# Patient Record
Sex: Female | Born: 1965 | Race: White | Hispanic: No | Marital: Single
Health system: Southern US, Community
[De-identification: ages and names within clinical notes are randomized; demographics above are authoritative.]

## PROBLEM LIST (undated history)

## (undated) DIAGNOSIS — E079 Disorder of thyroid, unspecified: Secondary | ICD-10-CM

## (undated) DIAGNOSIS — J45909 Unspecified asthma, uncomplicated: Secondary | ICD-10-CM

## (undated) DIAGNOSIS — F1910 Other psychoactive substance abuse, uncomplicated: Secondary | ICD-10-CM

## (undated) DIAGNOSIS — F419 Anxiety disorder, unspecified: Secondary | ICD-10-CM

## (undated) DIAGNOSIS — H9190 Unspecified hearing loss, unspecified ear: Secondary | ICD-10-CM

## (undated) DIAGNOSIS — F329 Major depressive disorder, single episode, unspecified: Secondary | ICD-10-CM

## (undated) DIAGNOSIS — R87619 Unspecified abnormal cytological findings in specimens from cervix uteri: Secondary | ICD-10-CM

## (undated) DIAGNOSIS — F32A Depression, unspecified: Secondary | ICD-10-CM

## (undated) DIAGNOSIS — E039 Hypothyroidism, unspecified: Secondary | ICD-10-CM

## (undated) DIAGNOSIS — G43909 Migraine, unspecified, not intractable, without status migrainosus: Secondary | ICD-10-CM

## (undated) DIAGNOSIS — J449 Chronic obstructive pulmonary disease, unspecified: Secondary | ICD-10-CM

## (undated) DIAGNOSIS — N946 Dysmenorrhea, unspecified: Secondary | ICD-10-CM

## (undated) HISTORY — PX: CERVICAL BIOPSY  W/ LOOP ELECTRODE EXCISION: SUR135

## (undated) HISTORY — DX: Migraine, unspecified, not intractable, without status migrainosus: G43.909

## (undated) HISTORY — DX: Anxiety disorder, unspecified: F41.9

## (undated) HISTORY — PX: ILIAC ARTERY STENT: SHX1786

## (undated) HISTORY — DX: Other psychoactive substance abuse, uncomplicated: F19.10

## (undated) HISTORY — DX: Unspecified asthma, uncomplicated: J45.909

## (undated) HISTORY — DX: Major depressive disorder, single episode, unspecified: F32.9

## (undated) HISTORY — DX: Dysmenorrhea, unspecified: N94.6

## (undated) HISTORY — DX: Unspecified abnormal cytological findings in specimens from cervix uteri: R87.619

## (undated) HISTORY — PX: ABDOMINAL HYSTERECTOMY: SHX81

## (undated) HISTORY — PX: CHOLECYSTECTOMY: SHX55

## (undated) HISTORY — DX: Chronic obstructive pulmonary disease, unspecified: J44.9

## (undated) HISTORY — DX: Depression, unspecified: F32.A

---

## 2007-07-26 ENCOUNTER — Inpatient Hospital Stay: Payer: Self-pay | Admitting: Unknown Physician Specialty

## 2008-06-14 ENCOUNTER — Emergency Department (HOSPITAL_COMMUNITY): Admission: EM | Admit: 2008-06-14 | Discharge: 2008-06-14 | Payer: Self-pay | Admitting: Emergency Medicine

## 2011-01-05 ENCOUNTER — Emergency Department (HOSPITAL_COMMUNITY)
Admission: EM | Admit: 2011-01-05 | Discharge: 2011-01-05 | Disposition: A | Discharge status: Home / Self Care | Payer: Self-pay | Attending: Orthopaedic Surgery | Admitting: Orthopaedic Surgery

## 2011-01-05 ENCOUNTER — Emergency Department (HOSPITAL_COMMUNITY): Payer: Self-pay

## 2011-01-05 DIAGNOSIS — J069 Acute upper respiratory infection, unspecified: Secondary | ICD-10-CM | POA: Insufficient documentation

## 2011-01-05 DIAGNOSIS — R0602 Shortness of breath: Secondary | ICD-10-CM | POA: Insufficient documentation

## 2011-01-05 DIAGNOSIS — J40 Bronchitis, not specified as acute or chronic: Secondary | ICD-10-CM | POA: Insufficient documentation

## 2011-01-05 DIAGNOSIS — R062 Wheezing: Secondary | ICD-10-CM | POA: Insufficient documentation

## 2013-01-30 ENCOUNTER — Emergency Department (HOSPITAL_BASED_OUTPATIENT_CLINIC_OR_DEPARTMENT_OTHER): Payer: Self-pay

## 2013-01-30 ENCOUNTER — Emergency Department (HOSPITAL_BASED_OUTPATIENT_CLINIC_OR_DEPARTMENT_OTHER)
Admission: EM | Admit: 2013-01-30 | Discharge: 2013-01-30 | Disposition: A | Discharge status: Home / Self Care | Payer: Self-pay | Attending: Emergency Medicine | Admitting: Emergency Medicine

## 2013-01-30 ENCOUNTER — Encounter (HOSPITAL_BASED_OUTPATIENT_CLINIC_OR_DEPARTMENT_OTHER): Payer: Self-pay | Admitting: *Deleted

## 2013-01-30 DIAGNOSIS — F17200 Nicotine dependence, unspecified, uncomplicated: Secondary | ICD-10-CM | POA: Insufficient documentation

## 2013-01-30 DIAGNOSIS — Y9389 Activity, other specified: Secondary | ICD-10-CM | POA: Insufficient documentation

## 2013-01-30 DIAGNOSIS — Y929 Unspecified place or not applicable: Secondary | ICD-10-CM | POA: Insufficient documentation

## 2013-01-30 DIAGNOSIS — T148XXA Other injury of unspecified body region, initial encounter: Secondary | ICD-10-CM

## 2013-01-30 DIAGNOSIS — Z79899 Other long term (current) drug therapy: Secondary | ICD-10-CM | POA: Insufficient documentation

## 2013-01-30 DIAGNOSIS — IMO0002 Reserved for concepts with insufficient information to code with codable children: Secondary | ICD-10-CM | POA: Insufficient documentation

## 2013-01-30 DIAGNOSIS — E079 Disorder of thyroid, unspecified: Secondary | ICD-10-CM | POA: Insufficient documentation

## 2013-01-30 HISTORY — DX: Disorder of thyroid, unspecified: E07.9

## 2013-01-30 MED ORDER — CEPHALEXIN 500 MG PO CAPS: 500.0000 mg | ORAL_CAPSULE | Freq: Four times a day (QID) | ORAL | Status: DC

## 2013-01-30 MED ORDER — HYDROCODONE-ACETAMINOPHEN 5-325 MG PO TABS
2.0000 | ORAL_TABLET | Freq: Once | ORAL | Status: AC
  Administered 2013-01-30: 2 via ORAL
  Filled 2013-01-30: qty 2

## 2013-01-30 MED ORDER — OXYCODONE-ACETAMINOPHEN 5-325 MG PO TABS: 2.0000 | ORAL_TABLET | ORAL | Status: DC | PRN

## 2013-01-30 MED ORDER — LIDOCAINE-EPINEPHRINE-TETRACAINE (LET) SOLUTION
3.0000 mL | Freq: Once | NASAL | Status: AC
  Administered 2013-01-30: 3 mL via TOPICAL
  Filled 2013-01-30: qty 3

## 2013-01-30 NOTE — ED Provider Notes (Signed)
This chart was scribed for Glynn Octave, MD by Ardelia Mems, ED Scribe. This patient was seen in room MH02/MH02 and the patient's care was started at 9:18 PM.   HPI Pamela Pham is a 47 y.o. female who presents to the Emergency Department complaining of a foreign body in her right lower arm. She states that she was moving a wooden table and accidentally sustained a splinter to the area. She reports associated pain to the area. Husband is in the room and states that he removed at least some of the splinter, but he suspect that there is still some wooden debris remaining. 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 She states that she is otherwise healthy without chronic medical conditions. She states that her Tetanus vaccinations are UTD. She denies having a history of DM. She denies any other pain or symptoms.   PE Findings Puncture wound to right proximal forearm. There is a firm, palpable foreign body, 1 cm proximal to puncture site with shadowing on [place].  Unsuccessful attempted bedside removal of FB. +2 radial pulse, cardinal hand movements intact, sensation intact.  Will need referral to hand surgery.  I personally performed the services described in this documentation, which was scribed in my presence. The recorded information has been reviewed and is accurate.   Glynn Octave, MD 02/03/13 [phone]

## 2013-01-30 NOTE — ED Notes (Signed)
Pt ambulating independently w/ steady gait on d/c in no acute distress, A&Ox4. D/c instructions reviewed w/ pt and family - pt and family deny any further questions or concerns at present. Rx given x2, Pt instructed to not use alcohol, drive, or operate heavy machinery while take the prescription pain medications as they could make him drowsy - pt verbalized understanding.    

## 2013-01-30 NOTE — ED Provider Notes (Signed)
Medical screening examination/treatment/procedure(s) were conducted as a shared visit with non-physician practitioner(s) and myself.  I personally evaluated the patient during the encounter  See my additional note  Glynn Octave, MD 01/30/13 2302

## 2013-01-30 NOTE — ED Notes (Signed)
Pt c/o wooden " spinlter" under skin to right arm

## 2013-01-30 NOTE — ED Provider Notes (Signed)
CSN: [id number]     Arrival date & time 01/30/13  2006 History   First MD Initiated Contact with Patient 01/30/13 2022     Chief Complaint  Patient presents with  . Foreign Body in Skin   (Consider location/radiation/quality/duration/timing/severity/associated sxs/prior Treatment) Patient is a 47 y.o. female presenting with foreign body. The history is provided by the patient. No language interpreter was used.  Foreign Body Intake: right forearm. Suspected object:  Wood Pain quality:  Aching Pain severity:  Moderate Timing:  Constant Progression:  Worsening Chronicity:  New Pt reports she was getting out of her reclyner and a piece of wood splintered into her arm.  Pt's husband reports he pulled out a s[plinter from area.  Pt complains of a lot of pain at firm area above splinter punture.     Past Medical History  Diagnosis Date  . Thyroid disease    Past Surgical History  Procedure Laterality Date  . Cholecystectomy    . Cesarean section     History reviewed. No pertinent family history. History  Substance Use Topics  . Smoking status: Current Every Day Smoker -- 1.00 packs/day    Types: Cigarettes  . Smokeless tobacco: Not on file  . Alcohol Use: No   OB History   Grav Para Term Preterm Abortions TAB SAB Ect Mult Living                 Review of Systems  Skin: Positive for wound.  All other systems reviewed and are negative.    Allergies  Sulfa antibiotics  Home Medications   Current Outpatient Rx  Name  Route  Sig  Dispense  Refill  . levothyroxine (SYNTHROID, LEVOTHROID) 100 MCG tablet   Oral   Take 100 mcg by mouth daily before breakfast.          BP 104/64  Pulse 77  Temp(Src) 98.5 F (36.9 C)  Resp 16  Ht 5\' 2"  (1.575 m)  Wt 135 lb (61.236 kg)  BMI 24.69 kg/m2  SpO2 100%  LMP 01/26/2013 Physical Exam  Vitals reviewed. Constitutional: She is oriented to person, place, and time. She appears well-developed and well-nourished.  HENT:   Head: Normocephalic.  Musculoskeletal: She exhibits tenderness.  Small punture wound forearm.   Pea sized firm area right arm approx 1.5 cm.  Painful to touch  Neurological: She is alert and oriented to person, place, and time. She has normal reflexes.  Skin: Skin is warm.  Psychiatric: She has a normal mood and affect.    ED Course  FOREIGN BODY REMOVAL Date/Time: 01/30/2013 10:20 PM Performed by: Elson Areas Authorized by: Elson Areas Consent: Verbal consent obtained. Consent given by: patient Patient understanding: patient states understanding of the procedure being performed Site marked: the operative site was marked Patient identity confirmed: verbally with patient Time out: Immediately prior to procedure a "time out" was called to verify the correct patient, procedure, equipment, support staff and site/side marked as required. Body area: skin Anesthesia: local infiltration Local anesthetic: lidocaine 2% without epinephrine Patient restrained: no Depth: subcutaneous Complexity: complex 0 objects recovered. Comments: As soon as I made small incision pt had bleeding at site and area of knot like swelling resolved.   I probed area and I am unable to locate foreign body,  Dr. Manus Gunning probed wound as well.  Pt may have persistent foreign body that is deeper than hematoma.     (including critical care time) Labs Review Labs Reviewed - No  data to display Imaging Review No results found. Xray shows no foreign body,   Ultrasound by Dr. Manus Gunning shows no foreign body,  Pt has significant discomfort at area,  I attempted incision at site and was unable to locate foreign body,  Dr. Manus Gunning explored as well.   I counseled pt on foreign bodies,  I suspect pt had a hematoma at site that resolved with small incision.    MDM   1. Foreign body in skin     I was unable to remove foreign body,    I will treat with antibiotics and pain medication.   I advised pt to call Dr. Merlyn Lot to be  seen this week.      Lonia Skinner [place], PA-C 01/30/13 2227

## 2013-02-01 ENCOUNTER — Other Ambulatory Visit: Payer: Self-pay | Admitting: Orthopedic Surgery

## 2013-02-01 ENCOUNTER — Encounter (HOSPITAL_BASED_OUTPATIENT_CLINIC_OR_DEPARTMENT_OTHER): Payer: Self-pay | Admitting: *Deleted

## 2013-02-01 NOTE — Progress Notes (Signed)
No labs needed

## 2013-02-02 ENCOUNTER — Encounter (HOSPITAL_BASED_OUTPATIENT_CLINIC_OR_DEPARTMENT_OTHER): Payer: Self-pay | Admitting: Anesthesiology

## 2013-02-02 ENCOUNTER — Encounter (HOSPITAL_BASED_OUTPATIENT_CLINIC_OR_DEPARTMENT_OTHER)
Admission: RE | Disposition: A | Discharge status: Home / Self Care | Payer: Self-pay | Source: Ambulatory Visit | Attending: Orthopedic Surgery

## 2013-02-02 ENCOUNTER — Ambulatory Visit (HOSPITAL_BASED_OUTPATIENT_CLINIC_OR_DEPARTMENT_OTHER): Payer: Self-pay | Admitting: Anesthesiology

## 2013-02-02 ENCOUNTER — Encounter (HOSPITAL_BASED_OUTPATIENT_CLINIC_OR_DEPARTMENT_OTHER): Payer: Self-pay

## 2013-02-02 ENCOUNTER — Ambulatory Visit (HOSPITAL_BASED_OUTPATIENT_CLINIC_OR_DEPARTMENT_OTHER)
Admission: RE | Admit: 2013-02-02 | Discharge: 2013-02-02 | Disposition: A | Discharge status: Home / Self Care | Payer: Self-pay | Source: Ambulatory Visit | Attending: Orthopedic Surgery | Admitting: Orthopedic Surgery

## 2013-02-02 DIAGNOSIS — E039 Hypothyroidism, unspecified: Secondary | ICD-10-CM | POA: Insufficient documentation

## 2013-02-02 DIAGNOSIS — IMO0002 Reserved for concepts with insufficient information to code with codable children: Secondary | ICD-10-CM

## 2013-02-02 DIAGNOSIS — H9190 Unspecified hearing loss, unspecified ear: Secondary | ICD-10-CM | POA: Insufficient documentation

## 2013-02-02 DIAGNOSIS — F17200 Nicotine dependence, unspecified, uncomplicated: Secondary | ICD-10-CM | POA: Insufficient documentation

## 2013-02-02 DIAGNOSIS — Z79899 Other long term (current) drug therapy: Secondary | ICD-10-CM | POA: Insufficient documentation

## 2013-02-02 DIAGNOSIS — Z1833 Retained wood fragments: Secondary | ICD-10-CM | POA: Insufficient documentation

## 2013-02-02 DIAGNOSIS — S51809A Unspecified open wound of unspecified forearm, initial encounter: Secondary | ICD-10-CM | POA: Insufficient documentation

## 2013-02-02 DIAGNOSIS — X58XXXA Exposure to other specified factors, initial encounter: Secondary | ICD-10-CM | POA: Insufficient documentation

## 2013-02-02 HISTORY — PX: FOREIGN BODY REMOVAL: SHX962

## 2013-02-02 HISTORY — DX: Hypothyroidism, unspecified: E03.9

## 2013-02-02 HISTORY — DX: Unspecified hearing loss, unspecified ear: H91.90

## 2013-02-02 LAB — POCT HEMOGLOBIN-HEMACUE: Hemoglobin: 13.9 g/dL (ref 12.0–15.0)

## 2013-02-02 SURGERY — REMOVAL FOREIGN BODY EXTREMITY
Anesthesia: General | Site: Arm Lower | Laterality: Right | Wound class: Dirty or Infected

## 2013-02-02 MED ORDER — TRAMADOL HCL 50 MG PO TABS: 50.0000 mg | ORAL_TABLET | Freq: Four times a day (QID) | ORAL | Status: DC | PRN

## 2013-02-02 MED ORDER — 0.9 % SODIUM CHLORIDE (POUR BTL) OPTIME
TOPICAL | Status: DC | PRN
  Administered 2013-02-02: 200 mL

## 2013-02-02 MED ORDER — FENTANYL CITRATE 0.05 MG/ML IJ SOLN
INTRAMUSCULAR | Status: DC | PRN
  Administered 2013-02-02: 100 ug via INTRAVENOUS

## 2013-02-02 MED ORDER — ONDANSETRON HCL 4 MG/2ML IJ SOLN: 4.0000 mg | Freq: Once | INTRAMUSCULAR | Status: DC | PRN

## 2013-02-02 MED ORDER — OXYCODONE HCL 5 MG/5ML PO SOLN: 5.0000 mg | Freq: Once | ORAL | Status: DC | PRN

## 2013-02-02 MED ORDER — LIDOCAINE HCL (CARDIAC) 20 MG/ML IV SOLN
INTRAVENOUS | Status: DC | PRN
  Administered 2013-02-02: 80 mg via INTRAVENOUS

## 2013-02-02 MED ORDER — CEFAZOLIN SODIUM-DEXTROSE 2-3 GM-% IV SOLR
INTRAVENOUS | Status: DC | PRN
  Administered 2013-02-02: 2 g via INTRAVENOUS

## 2013-02-02 MED ORDER — PROPOFOL 10 MG/ML IV BOLUS
INTRAVENOUS | Status: DC | PRN
  Administered 2013-02-02: 200 mg via INTRAVENOUS

## 2013-02-02 MED ORDER — MIDAZOLAM HCL 5 MG/5ML IJ SOLN
INTRAMUSCULAR | Status: DC | PRN
  Administered 2013-02-02: 2 mg via INTRAVENOUS

## 2013-02-02 MED ORDER — LACTATED RINGERS IV SOLN
INTRAVENOUS | Status: DC
  Administered 2013-02-02: 11:00:00 via INTRAVENOUS

## 2013-02-02 MED ORDER — DEXAMETHASONE SODIUM PHOSPHATE 10 MG/ML IJ SOLN
INTRAMUSCULAR | Status: DC | PRN
  Administered 2013-02-02: 10 mg via INTRAVENOUS

## 2013-02-02 MED ORDER — OXYCODONE HCL 5 MG PO TABS: 5.0000 mg | ORAL_TABLET | Freq: Once | ORAL | Status: DC | PRN

## 2013-02-02 MED ORDER — ONDANSETRON HCL 4 MG/2ML IJ SOLN
INTRAMUSCULAR | Status: DC | PRN
  Administered 2013-02-02: 4 mg via INTRAMUSCULAR

## 2013-02-02 MED ORDER — BUPIVACAINE HCL (PF) 0.25 % IJ SOLN
INTRAMUSCULAR | Status: DC | PRN
  Administered 2013-02-02: 7 mL

## 2013-02-02 MED ORDER — HYDROMORPHONE HCL PF 1 MG/ML IJ SOLN
0.2500 mg | INTRAMUSCULAR | Status: DC | PRN
  Administered 2013-02-02 (×2): 0.5 mg via INTRAVENOUS

## 2013-02-02 SURGICAL SUPPLY — 47 items
BANDAGE ELASTIC 3 VELCRO ST LF (GAUZE/BANDAGES/DRESSINGS) ×1 IMPLANT
BANDAGE ELASTIC 4 VELCRO ST LF (GAUZE/BANDAGES/DRESSINGS) ×1 IMPLANT
BANDAGE GAUZE ELAST BULKY 4 IN (GAUZE/BANDAGES/DRESSINGS) IMPLANT
BLADE SURG 15 STRL LF DISP TIS (BLADE) ×1 IMPLANT
BLADE SURG 15 STRL SS (BLADE) ×2
BNDG CMPR 9X4 STRL LF SNTH (GAUZE/BANDAGES/DRESSINGS) ×1
BNDG CMPR MD 5X2 ELC HKLP STRL (GAUZE/BANDAGES/DRESSINGS)
BNDG ELASTIC 2 VLCR STRL LF (GAUZE/BANDAGES/DRESSINGS) IMPLANT
BNDG ESMARK 4X9 LF (GAUZE/BANDAGES/DRESSINGS) ×1 IMPLANT
CANISTER SUCTION 1200CC (MISCELLANEOUS) IMPLANT
CORDS BIPOLAR (ELECTRODE) ×1 IMPLANT
COVER TABLE BACK 60X90 (DRAPES) ×2 IMPLANT
DECANTER SPIKE VIAL GLASS SM (MISCELLANEOUS) IMPLANT
DRAPE EXTREMITY T 121X128X90 (DRAPE) ×2 IMPLANT
DRAPE OEC MINIVIEW 54X84 (DRAPES) ×1 IMPLANT
DRAPE SURG 17X23 STRL (DRAPES) ×2 IMPLANT
DURAPREP 26ML APPLICATOR (WOUND CARE) ×2 IMPLANT
GAUZE SPONGE 4X4 16PLY XRAY LF (GAUZE/BANDAGES/DRESSINGS) ×1 IMPLANT
GAUZE XEROFORM 1X8 LF (GAUZE/BANDAGES/DRESSINGS) ×1 IMPLANT
GLOVE BIO SURGEON STRL SZ8 (GLOVE) ×2 IMPLANT
GLOVE BIOGEL PI IND STRL 7.0 (GLOVE) IMPLANT
GLOVE BIOGEL PI INDICATOR 7.0 (GLOVE) ×1
GLOVE ECLIPSE 6.5 STRL STRAW (GLOVE) ×1 IMPLANT
GOWN BRE IMP PREV XXLGXLNG (GOWN DISPOSABLE) ×2 IMPLANT
NDL HYPO 25X1 1.5 SAFETY (NEEDLE) IMPLANT
NEEDLE HYPO 25X1 1.5 SAFETY (NEEDLE) ×2 IMPLANT
NS IRRIG 1000ML POUR BTL (IV SOLUTION) IMPLANT
PACK BASIN DAY SURGERY FS (CUSTOM PROCEDURE TRAY) ×2 IMPLANT
PAD CAST 3X4 CTTN HI CHSV (CAST SUPPLIES) ×1 IMPLANT
PAD CAST 4YDX4 CTTN HI CHSV (CAST SUPPLIES) IMPLANT
PADDING CAST ABS 4INX4YD NS (CAST SUPPLIES)
PADDING CAST ABS COTTON 4X4 ST (CAST SUPPLIES) ×1 IMPLANT
PADDING CAST COTTON 3X4 STRL (CAST SUPPLIES)
PADDING CAST COTTON 4X4 STRL (CAST SUPPLIES) ×2
PADDING UNDERCAST 2  STERILE (CAST SUPPLIES) ×1 IMPLANT
SHEET MEDIUM DRAPE 40X70 STRL (DRAPES) ×4 IMPLANT
STOCKINETTE 4X48 STRL (DRAPES) ×2 IMPLANT
STRIP CLOSURE SKIN 1/2X4 (GAUZE/BANDAGES/DRESSINGS) IMPLANT
SUCTION FRAZIER TIP 10 FR DISP (SUCTIONS) IMPLANT
SUT ETHILON 5 0 P 3 18 (SUTURE) ×1
SUT NYLON ETHILON 5-0 P-3 1X18 (SUTURE) IMPLANT
SUT VICRYL RAPIDE 4/0 PS 2 (SUTURE) ×1 IMPLANT
SYR BULB 3OZ (MISCELLANEOUS) ×1 IMPLANT
SYRINGE 10CC LL (SYRINGE) ×1 IMPLANT
TOWEL OR 17X24 6PK STRL BLUE (TOWEL DISPOSABLE) ×2 IMPLANT
TUBE CONNECTING 20X1/4 (TUBING) IMPLANT
UNDERPAD 30X30 INCONTINENT (UNDERPADS AND DIAPERS) ×2 IMPLANT

## 2013-02-02 NOTE — Transfer of Care (Signed)
Immediate Anesthesia Transfer of Care Note  Patient: Pamela Pham  Procedure(s) Performed: Procedure(s) with comments: Exploration right forearm with removal foreign body (Right) - Foreign body given to patient.  Patient Location: PACU  Anesthesia Type:General  Level of Consciousness: sedated  Airway & Oxygen Therapy: Patient Spontanous Breathing and Patient connected to face mask oxygen  Post-op Assessment: Report given to PACU RN and Post -op Vital signs reviewed and stable  Post vital signs: Reviewed and stable  Complications: No apparent anesthesia complications

## 2013-02-02 NOTE — H&P (Signed)
Pamela Pham is an 47 y.o. female.   Chief Complaint: right forearm pain and swelling HPI: as above s/p wooden splinter into right forearm  Past Medical History  Diagnosis Date  . Thyroid disease   . Hypothyroidism   . HOH (hard of hearing)     right ear    Past Surgical History  Procedure Laterality Date  . Cholecystectomy    . Cesarean section      History reviewed. No pertinent family history. Social History:  reports that she has been smoking Cigarettes.  She has been smoking about 1.00 pack per day. She does not have any smokeless tobacco history on file. She reports that she does not drink alcohol or use illicit drugs.  Allergies:  Allergies  Allergen Reactions  . Sulfa Antibiotics Nausea And Vomiting    Medications Prior to Admission  Medication Sig Dispense Refill  . cephALEXin (KEFLEX) 500 MG capsule Take 1 capsule (500 mg total) by mouth 4 (four) times daily.  40 capsule  0  . levothyroxine (SYNTHROID, LEVOTHROID) 100 MCG tablet Take 100 mcg by mouth daily before breakfast.      . oxyCODONE-acetaminophen (PERCOCET/ROXICET) 5-325 MG per tablet Take 2 tablets by mouth every 4 (four) hours as needed for pain.  16 tablet  0  . traMADol (ULTRAM) 50 MG tablet Take 50 mg by mouth every 6 (six) hours as needed for pain.        No results found for this or any previous visit (from the past 48 hour(s)). No results found.  Review of Systems  All other systems reviewed and are negative.    Blood pressure 114/76, pulse 66, temperature 97.8 F (36.6 C), temperature source Oral, resp. rate 18, height 5\' 2"  (1.575 m), weight 63.05 kg (139 lb), last menstrual period 01/20/2013, SpO2 100.00%. Physical Exam  Constitutional: She is oriented to person, place, and time. She appears well-developed and well-nourished.  HENT:  Head: Normocephalic and atraumatic.  Cardiovascular: Normal rate.   Respiratory: Effort normal.  Musculoskeletal:       Right forearm: She exhibits  tenderness, swelling and edema.  Foreign body right dorsoradial forearm  Neurological: She is alert and oriented to person, place, and time.  Skin: Skin is warm.  Psychiatric: She has a normal mood and affect. Her behavior is normal. Judgment and thought content normal.     Assessment/Plan As above  Plan explore and remove above  Gavon Majano A 02/02/2013, 10:57 AM

## 2013-02-02 NOTE — Anesthesia Postprocedure Evaluation (Signed)
  Anesthesia Post-op Note  Patient: Pamela Pham  Procedure(s) Performed: Procedure(s) with comments: Exploration right forearm with removal foreign body (Right) - Foreign body given to patient.  Patient Location: PACU  Anesthesia Type:General  Level of Consciousness: awake, alert  and oriented  Airway and Oxygen Therapy: Patient Spontanous Breathing  Post-op Pain: mild  Post-op Assessment: Post-op Vital signs reviewed  Post-op Vital Signs: Reviewed  Complications: No apparent anesthesia complications

## 2013-02-02 NOTE — Anesthesia Procedure Notes (Signed)
Procedure Name: LMA Insertion Date/Time: 02/02/2013 11:22 AM Performed by: Burna Cash Pre-anesthesia Checklist: Patient identified, Emergency Drugs available, Suction available and Patient being monitored Patient Re-evaluated:Patient Re-evaluated prior to inductionOxygen Delivery Method: Circle System Utilized Preoxygenation: Pre-oxygenation with 100% oxygen Intubation Type: IV induction Ventilation: Mask ventilation without difficulty LMA: LMA inserted LMA Size: 4.0 Number of attempts: 1 Airway Equipment and Method: bite block Placement Confirmation: positive ETCO2 Tube secured with: Tape Dental Injury: Teeth and Oropharynx as per pre-operative assessment

## 2013-02-02 NOTE — Op Note (Signed)
See note [id number]

## 2013-02-02 NOTE — Anesthesia Preprocedure Evaluation (Signed)
Anesthesia Evaluation  Patient identified by MRN, date of birth, ID band Patient awake    Reviewed: Allergy & Precautions, H&P , NPO status , Patient's Chart, lab work & pertinent test results  Airway Mallampati: II TM Distance: >3 FB Neck ROM: Full    Dental  (+) Teeth Intact and Dental Advisory Given   Pulmonary asthma ,  breath sounds clear to auscultation        Cardiovascular Rhythm:Regular Rate:Normal     Neuro/Psych    GI/Hepatic   Endo/Other  Hypothyroidism   Renal/GU      Musculoskeletal   Abdominal   Peds  Hematology   Anesthesia Other Findings   Reproductive/Obstetrics                           Anesthesia Physical Anesthesia Plan  ASA: II  Anesthesia Plan: General   Post-op Pain Management:    Induction: Intravenous  Airway Management Planned: LMA  Additional Equipment:   Intra-op Plan:   Post-operative Plan: Extubation in OR  Informed Consent: I have reviewed the patients History and Physical, chart, labs and discussed the procedure including the risks, benefits and alternatives for the proposed anesthesia with the patient or authorized representative who has indicated his/her understanding and acceptance.   Dental advisory given  Plan Discussed with: CRNA, Anesthesiologist and Surgeon  Anesthesia Plan Comments:         Anesthesia Quick Evaluation

## 2013-02-03 ENCOUNTER — Encounter (HOSPITAL_BASED_OUTPATIENT_CLINIC_OR_DEPARTMENT_OTHER): Payer: Self-pay | Admitting: Orthopedic Surgery

## 2013-02-03 NOTE — Op Note (Signed)
Pamela Pham, HANAWAY NO.:  [id number]  MEDICAL RECORD NO.:  [id number]  LOCATION:  MH02                         FACILITY:  [facility]  PHYSICIAN:  Artist Pais. Demani Weyrauch, M.D.DATE OF BIRTH:  09/01/1965  DATE OF PROCEDURE:  02/02/2013 DATE OF DISCHARGE:  02/02/2013                              OPERATIVE REPORT   PREOPERATIVE DIAGNOSIS:  Imbedded foreign material, deep right forearm.  POSTOPERATIVE DIAGNOSIS:  Imbedded foreign material, deep right forearm.  PROCEDURE:  Incision and drainage and foreign body removal, proximal aspect of right forearm.  SURGEON:  Artist Pais. Mina Marble, M.D.  ASSISTANT:  None.  ANESTHESIA:  General.  COMPLICATIONS:  No complications.  DRAINS:  No drains.  DESCRIPTION OF PROCEDURE:  The patient was taken to the operating suite. After induction of adequate general anesthesia, the right upper extremity was prepped and draped in the sterile fashion.  An Esmarch was used to exsanguinate the limb.  Tourniquet was inflated to 250 mmHg.  At this point in time, an incision was made over the palpable edge of the large wooden splinter, just on the lateral side of the brachioradialis muscle.  Skin was incised 3-4 cm.  The splinter was seen coming emanating from brachioradialis muscle fascia.  It was carefully removed. The wound was then thoroughly irrigated with 500 mL of normal saline. It was then loosely closed with 4-0 Vicryl Rapide suture and x2 horizontal mattress sutures.  Xeroform, 4 x 4's, and compressive wrap was applied.  The patient tolerated the procedure well in a concealed fashion.     Artist Pais Mina Marble, M.D.     MAW/MEDQ  D:  02/02/2013  T:  02/03/2013  Job:  [id number]

## 2013-02-03 NOTE — Op Note (Deleted)
NAME:  Pamela Pham, Pamela Pham             ACCOUNT NO.:  [id number]  MEDICAL RECORD NO.:  [id number]  LOCATION:  MH02                         FACILITY:  [facility]  PHYSICIAN:  Shailene Demonbreun A. Darlina Mccaughey, M.D.DATE OF BIRTH:  05/13/1965  DATE OF PROCEDURE:  02/02/2013 DATE OF DISCHARGE:  02/02/2013                              OPERATIVE REPORT   PREOPERATIVE DIAGNOSIS:  Imbedded foreign material, deep right forearm.  POSTOPERATIVE DIAGNOSIS:  Imbedded foreign material, deep right forearm.  PROCEDURE:  Incision and drainage and foreign body removal, proximal aspect of right forearm.  SURGEON:  Whitlee Sluder A. Ruhani Umland, M.D.  ASSISTANT:  None.  ANESTHESIA:  General.  COMPLICATIONS:  No complications.  DRAINS:  No drains.  DESCRIPTION OF PROCEDURE:  The patient was taken to the operating suite. After induction of adequate general anesthesia, the right upper extremity was prepped and draped in the sterile fashion.  An Esmarch was used to exsanguinate the limb.  Tourniquet was inflated to 250 mmHg.  At this point in time, an incision was made over the palpable edge of the large wooden splinter, just on the lateral side of the brachioradialis muscle.  Skin was incised 3-4 cm.  The splinter was seen coming emanating from brachioradialis muscle fascia.  It was carefully removed. The wound was then thoroughly irrigated with 500 mL of normal saline. It was then loosely closed with 4-0 Vicryl Rapide suture and x2 horizontal mattress sutures.  Xeroform, 4 x 4's, and compressive wrap was applied.  The patient tolerated the procedure well in a concealed fashion.     Bryttani Blew A. Carolee Channell, M.D.     MAW/MEDQ  D:  02/02/2013  T:  02/03/2013  Job:  [id number] 

## 2013-06-08 ENCOUNTER — Encounter (HOSPITAL_BASED_OUTPATIENT_CLINIC_OR_DEPARTMENT_OTHER): Payer: Self-pay | Admitting: Emergency Medicine

## 2013-06-08 ENCOUNTER — Emergency Department (HOSPITAL_BASED_OUTPATIENT_CLINIC_OR_DEPARTMENT_OTHER)
Admission: EM | Admit: 2013-06-08 | Discharge: 2013-06-08 | Disposition: A | Discharge status: Home / Self Care | Payer: BC Managed Care – PPO | Attending: Emergency Medicine | Admitting: Emergency Medicine

## 2013-06-08 ENCOUNTER — Emergency Department (HOSPITAL_BASED_OUTPATIENT_CLINIC_OR_DEPARTMENT_OTHER): Payer: BC Managed Care – PPO

## 2013-06-08 DIAGNOSIS — Y9389 Activity, other specified: Secondary | ICD-10-CM | POA: Insufficient documentation

## 2013-06-08 DIAGNOSIS — Z79899 Other long term (current) drug therapy: Secondary | ICD-10-CM | POA: Insufficient documentation

## 2013-06-08 DIAGNOSIS — Z792 Long term (current) use of antibiotics: Secondary | ICD-10-CM | POA: Insufficient documentation

## 2013-06-08 DIAGNOSIS — Y92410 Unspecified street and highway as the place of occurrence of the external cause: Secondary | ICD-10-CM | POA: Insufficient documentation

## 2013-06-08 DIAGNOSIS — Z8669 Personal history of other diseases of the nervous system and sense organs: Secondary | ICD-10-CM | POA: Insufficient documentation

## 2013-06-08 DIAGNOSIS — S161XXA Strain of muscle, fascia and tendon at neck level, initial encounter: Secondary | ICD-10-CM

## 2013-06-08 DIAGNOSIS — S139XXA Sprain of joints and ligaments of unspecified parts of neck, initial encounter: Secondary | ICD-10-CM | POA: Insufficient documentation

## 2013-06-08 DIAGNOSIS — F17200 Nicotine dependence, unspecified, uncomplicated: Secondary | ICD-10-CM | POA: Insufficient documentation

## 2013-06-08 DIAGNOSIS — E039 Hypothyroidism, unspecified: Secondary | ICD-10-CM | POA: Insufficient documentation

## 2013-06-08 MED ORDER — OXYCODONE-ACETAMINOPHEN 5-325 MG PO TABS
1.0000 | ORAL_TABLET | Freq: Once | ORAL | Status: AC
  Administered 2013-06-08: 1 via ORAL
  Filled 2013-06-08: qty 1

## 2013-06-08 MED ORDER — IBUPROFEN 800 MG PO TABS
800.0000 mg | ORAL_TABLET | Freq: Once | ORAL | Status: AC
  Administered 2013-06-08: 800 mg via ORAL
  Filled 2013-06-08: qty 1

## 2013-06-08 MED ORDER — ONDANSETRON 4 MG PO TBDP
4.0000 mg | ORAL_TABLET | Freq: Once | ORAL | Status: AC
  Administered 2013-06-08: 4 mg via ORAL

## 2013-06-08 MED ORDER — CYCLOBENZAPRINE HCL 5 MG PO TABS: 5.0000 mg | ORAL_TABLET | Freq: Three times a day (TID) | ORAL | Status: DC | PRN

## 2013-06-08 MED ORDER — ONDANSETRON 4 MG PO TBDP
ORAL_TABLET | ORAL | Status: AC
  Filled 2013-06-08: qty 1

## 2013-06-08 MED ORDER — IBUPROFEN 800 MG PO TABS: 800.0000 mg | ORAL_TABLET | Freq: Three times a day (TID) | ORAL | Status: DC

## 2013-06-08 MED ORDER — OXYCODONE-ACETAMINOPHEN 5-325 MG PO TABS: 1.0000 | ORAL_TABLET | Freq: Four times a day (QID) | ORAL | Status: DC | PRN

## 2013-06-08 NOTE — Discharge Instructions (Signed)
Return to the ED with any concerns including weakness of arms or legs, chest or abdominal pain, difficulty breathing, decreased level of alertness/lethargy, or any other alarming symptoms
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
## 2013-08-12 ENCOUNTER — Ambulatory Visit (INDEPENDENT_AMBULATORY_CARE_PROVIDER_SITE_OTHER): Payer: BC Managed Care – PPO | Admitting: Certified Nurse Midwife

## 2013-08-12 ENCOUNTER — Encounter: Payer: Self-pay | Admitting: Certified Nurse Midwife

## 2013-08-12 VITALS — BP 114/60 | HR 64 | Resp 16 | Ht 62.25 in | Wt 142.0 lb

## 2013-08-12 DIAGNOSIS — Z0000 Encounter for general adult medical examination without abnormal findings: Secondary | ICD-10-CM

## 2013-08-12 DIAGNOSIS — N920 Excessive and frequent menstruation with regular cycle: Secondary | ICD-10-CM

## 2013-08-12 DIAGNOSIS — Z01419 Encounter for gynecological examination (general) (routine) without abnormal findings: Secondary | ICD-10-CM

## 2013-08-12 DIAGNOSIS — N946 Dysmenorrhea, unspecified: Secondary | ICD-10-CM

## 2013-08-12 DIAGNOSIS — N852 Hypertrophy of uterus: Secondary | ICD-10-CM

## 2013-08-12 NOTE — Progress Notes (Signed)
48 y.o. [id number] Divorced Caucasian Fe here to establish cgyn carea and  for annual exam. Periods have been heavy and painful in the past, but has increased in the past 6 months. Patient has to pad at night to not soil bedding. " Ready to have this over with". Previous use of OCP until stent placed in iliac artery age 55 due to blood clot. No issues since stent placement.No STD concerns or screening desired. PCP does aex and labs and cholesterol management. Dr.Balan manages hypothyroid, no medication changes.(mother a patient here).   Patient's last menstrual period was 08/09/2013.          Sexually active: yes  The current method of family planning is none, fiance vasctomy   Exercising: yes  walking Smoker:  yes  Health Maintenance: Pap: 2000 History of abnormal pap with LEEP and 5 FU treatment. MMG:  07-26-13 normal Colonoscopy:  none BMD:   none TDaP:  2010 Labs: with PCP 2 weeks ago, cholesterol elevated. Self breast exam: done monthly   reports that she has been smoking Cigarettes.  She has been smoking about 1.00 pack per day. She does not have any smokeless tobacco history on file. She reports that she does not drink alcohol or use illicit drugs.  Past Medical History  Diagnosis Date  . Thyroid disease   . Hypothyroidism   . HOH (hard of hearing)     right ear  . Anxiety   . Depression   . Abnormal Pap smear of cervix   . Migraines   . Asthma   . Dysmenorrhea   . Substance abuse     recovering addict    Past Surgical History  Procedure Laterality Date  . Cholecystectomy    . Cesarean section    . Foreign body removal Right 02/02/2013    Procedure: Exploration right forearm with removal foreign body;  Surgeon: Schuyler Amor, MD;  Location: [facility];  Service: Orthopedics;  Laterality: Right;  Foreign body given to patient.  . Cervical biopsy  w/ loop electrode excision      in pts 20's  . Stint      put in iliac artery    Current Outpatient  Prescriptions  Medication Sig Dispense Refill  . Albuterol Sulfate (PROAIR HFA IN) Inhale into the lungs as needed.      . ATORVASTATIN CALCIUM PO Take by mouth daily.      . clonazePAM (KLONOPIN) 0.5 MG tablet daily.      Marland Kitchen levothyroxine (SYNTHROID, LEVOTHROID) 112 MCG tablet Take 112 mcg by mouth daily before breakfast.      . mirtazapine (REMERON) 30 MG tablet at bedtime.       No current facility-administered medications for this visit.    Family History  Problem Relation Age of Onset  . Cancer Maternal Grandmother     leukemia  . Diabetes Maternal Grandmother   . Stroke Maternal Grandmother   . Heart attack Mother   . Cancer Paternal Uncle   . Hypertension Maternal Grandfather   . Thyroid disease Paternal Grandmother     ROS:  Pertinent items are noted in HPI.  Otherwise, a comprehensive ROS was negative.  Exam:   BP 114/60  Pulse 64  Resp 16  Ht 5' 2.25" (1.581 m)  Wt 142 lb (64.411 kg)  BMI 25.77 kg/m2  LMP 08/09/2013 Height: 5' 2.25" (158.1 cm)  Ht Readings from Last 3 Encounters:  08/12/13 5' 2.25" (1.581 m)  06/08/13 5\' 2"  (1.575  m)  02/02/13 5\' 2"  (1.575 m)    General appearance: alert, cooperative and appears stated age Head: Normocephalic, without obvious abnormality, atraumatic Neck: no adenopathy, supple, symmetrical, trachea midline and thyroid normal to inspection and palpation and non-palpable Lungs: clear to auscultation bilaterally Breasts: normal appearance, no masses or tenderness, No nipple retraction or dimpling, No nipple discharge or bleeding, No axillary or supraclavicular adenopathy Heart: regular rate and rhythm Abdomen: soft, non-tender; no masses,  no organomegaly Extremities: extremities normal, atraumatic, no cyanosis or edema Skin: Skin color, texture, turgor normal. No rashes or lesions Lymph nodes: Cervical, supraclavicular, and axillary nodes normal. No abnormal inguinal nodes palpated Neurologic: Grossly normal   Pelvic:  External genitalia:  no lesions              Urethra:  normal appearing urethra with no masses, tenderness or lesions              Bartholin's and Skene's: normal                 Vagina: normal appearing vagina with normal color and discharge, no lesions              Cervix: normal, non tender              Pap taken: yes Bimanual Exam:  Uterus:  Enlarged 12-14 week size, non tender, soft              Adnexa: normal adnexa and no mass, fullness, tenderness               Rectovaginal: Confirms               Anus:  normal sphincter tone, no lesions  A:  Well Woman with normal exam  Contraception partner vasectomy  Enlarged uterus  Dysmenorrhea  Menorrhagia  History of iliac stent placement age 40 due to blood clot  History of abnormal pap smear with LEEP and 5FU use , no pap since 2000  P:   Reviewed health and wellness pertinent to exam  Discussed findings and possible fibroids, adenomyosis or mass or normal for her, but could be causing cycle change. Discussed need for evaluation with PUS,possible SHG and endo biopsy due to bleeding profile. Patient agreeable, interested in ablation or hysterectomy or IUD for cycle control.  Continue follow up as indicated with MD  Stressed pap smear follow up importance  Pap smear as per guidelines   Mammogram yearly pap smear taken today with HPVHR  counseled on breast self exam, adequate intake of calcium and vitamin D, diet and exercise  return annually or prn  An After Visit Summary was printed and given to the patient.

## 2013-08-12 NOTE — Patient Instructions (Signed)
EXERCISE AND DIET:  We recommended that you start or continue a regular exercise program for good health. Regular exercise means any activity that makes your heart beat faster and makes you sweat.  We recommend exercising at least 30 minutes per day at least 3 days a week, preferably 4 or 5.  We also recommend a diet low in fat and sugar.  Inactivity, poor dietary choices and obesity can cause diabetes, heart attack, stroke, and kidney damage, among others.    ALCOHOL AND SMOKING:  Women should limit their alcohol intake to no more than 7 drinks/beers/glasses of wine (combined, not each!) per week. Moderation of alcohol intake to this level decreases your risk of breast cancer and liver damage. And of course, no recreational drugs are part of a healthy lifestyle.  And absolutely no smoking or even second hand smoke. Most people know smoking can cause heart and lung diseases, but did you know it also contributes to weakening of your bones? Aging of your skin?  Yellowing of your teeth and nails?  CALCIUM AND VITAMIN D:  Adequate intake of calcium and Vitamin D are recommended.  The recommendations for exact amounts of these supplements seem to change often, but generally speaking 600 mg of calcium (either carbonate or citrate) and 800 units of Vitamin D per day seems prudent. Certain women may benefit from higher intake of Vitamin D.  If you are among these women, your doctor will have told you during your visit.    PAP SMEARS:  Pap smears, to check for cervical cancer or precancers,  have traditionally been done yearly, although recent scientific advances have shown that most women can have pap smears less often.  However, every woman still should have a physical exam from her gynecologist every year. It will include a breast check, inspection of the vulva and vagina to check for abnormal growths or skin changes, a visual exam of the cervix, and then an exam to evaluate the size and shape of the uterus and  ovaries.  And after 48 years of age, a rectal exam is indicated to check for rectal cancers. We will also provide age appropriate advice regarding health maintenance, like when you should have certain vaccines, screening for sexually transmitted diseases, bone density testing, colonoscopy, mammograms, etc.   MAMMOGRAMS:  All women over 40 years old should have a yearly mammogram. Many facilities now offer a "3D" mammogram, which may cost around $50 extra out of pocket. If possible,  we recommend you accept the option to have the 3D mammogram performed.  It both reduces the number of women who will be called back for extra views which then turn out to be normal, and it is better than the routine mammogram at detecting truly abnormal areas.    COLONOSCOPY:  Colonoscopy to screen for colon cancer is recommended for all women at age 50.  We know, you hate the idea of the prep.  We agree, BUT, having colon cancer and not knowing it is worse!!  Colon cancer so often starts as a polyp that can be seen and removed at colonscopy, which can quite literally save your life!  And if your first colonoscopy is normal and you have no family history of colon cancer, most women don't have to have it again for 10 years.  Once every ten years, you can do something that may end up saving your life, right?  We will be happy to help you get it scheduled when you are ready.    Be sure to check your insurance coverage so you understand how much it will cost.  It may be covered as a preventative service at no cost, but you should check your particular policy.     Menorrhagia Menorrhagia is the medical term for when your menstrual periods are heavy or last longer than usual. With menorrhagia, every period you have may cause enough blood loss and cramping that you are unable to maintain your usual activities. CAUSES  In some cases, the cause of heavy periods is unknown, but a number of conditions may cause menorrhagia. Common causes  include:  A problem with the hormone-producing thyroid gland (hypothyroid).  Noncancerous growths in the uterus (polyps or fibroids).  An imbalance of the estrogen and progesterone hormones.  One of your ovaries not releasing an egg during one or more months.  Side effects of having an intrauterine device (IUD).  Side effects of some medicines, such as anti-inflammatory medicines or blood thinners.  A bleeding disorder that stops your blood from clotting normally. SIGNS AND SYMPTOMS  During a normal period, bleeding lasts between 4 and 8 days. Signs that your periods are too heavy include:  You routinely have to change your pad or tampon every 1 or 2 hours because it is completely soaked.  You pass blood clots larger than 1 inch (2.5 cm) in size.  You have bleeding for more than 7 days.  You need to use pads and tampons at the same time because of heavy bleeding.  You need to wake up to change your pads or tampons during the night.  You have symptoms of anemia, such as tiredness, fatigue, or shortness of breath. DIAGNOSIS  Your health care provider will perform a physical exam and ask you questions about your symptoms and menstrual history. Other tests may be ordered based on what the health care provider finds during the exam. These tests can include:  Blood tests To check if you are pregnant or have hormonal changes, a bleeding or thyroid disorder, low iron levels (anemia), or other problems.  Endometrial biopsy Your health care provider takes a sample of tissue from the inside of your uterus to be examined under a microscope.  Pelvic ultrasound This test uses sound waves to make a picture of your uterus, ovaries, and vagina. The pictures can show if you have fibroids or other growths.  Hysteroscopy For this test, your health care provider will use a small telescope to look inside your uterus. Based on the results of your initial tests, your health care provider may  recommend further testing. TREATMENT  Treatment may not be needed. If it is needed, your health care provider may recommend treatment with one or more medicines first. If these do not reduce bleeding enough, a surgical treatment might be an option. The best treatment for you will depend on:   Whether you need to prevent pregnancy.  Your desire to have children in the future.  The cause and severity of your bleeding.  Your opinion and personal preference.  Medicines for menorrhagia may include:  Birth control methods that use hormones These include the pill, skin patch, vaginal ring, shots that you get every 3 months, hormonal IUD, and implant. These treatments reduce bleeding during your menstrual period.  Medicines that thicken blood and slow bleeding.  Medicines that reduce swelling, such as ibuprofen.  Medicines that contain a synthetic hormone called progestin.   Medicines that make the ovaries stop working for a short time.  You may need  surgical treatment for menorrhagia if the medicines are unsuccessful. Treatment options include:  Dilation and curettage (D&C) In this procedure, your health care provider opens (dilates) your cervix and then scrapes or suctions tissue from the lining of your uterus to reduce menstrual bleeding.  Operative hysteroscopy This procedure uses a tiny tube with a light (hysteroscope) to view your uterine cavity and can help in the surgical removal of a polyp that may be causing heavy periods.  Endometrial ablation Through various techniques, your health care provider permanently destroys the entire lining of your uterus (endometrium). After endometrial ablation, most women have little or no menstrual flow. Endometrial ablation reduces your ability to become pregnant.  Endometrial resection This surgical procedure uses an electrosurgical wire loop to remove the lining of the uterus. This procedure also reduces your ability to become  pregnant.  Hysterectomy Surgical removal of the uterus and cervix is a permanent procedure that stops menstrual periods. Pregnancy is not possible after a hysterectomy. This procedure requires anesthesia and hospitalization. HOME CARE INSTRUCTIONS   Only take over-the-counter or prescription medicines as directed by your health care provider. Take prescribed medicines exactly as directed. Do not change or switch medicines without consulting your health care provider.  Take any prescribed iron pills exactly as directed by your health care provider. Long-term heavy bleeding may result in low iron levels. Iron pills help replace the iron your body lost from heavy bleeding. Iron may cause constipation. If this becomes a problem, increase the bran, fruits, and roughage in your diet.  Do not take aspirin or medicines that contain aspirin 1 week before or during your menstrual period. Aspirin may make the bleeding worse.  If you need to change your sanitary pad or tampon more than once every 2 hours, stay in bed and rest as much as possible until the bleeding stops.  Eat well-balanced meals. Eat foods high in iron. Examples are leafy green vegetables, meat, liver, eggs, and whole grain breads and cereals. Do not try to lose weight until the abnormal bleeding has stopped and your blood iron level is back to normal. SEEK MEDICAL CARE IF:   You soak through a pad or tampon every 1 or 2 hours, and this happens every time you have a period.  You need to use pads and tampons at the same time because you are bleeding so much.  You need to change your pad or tampon during the night.  You have a period that lasts for more than 8 days.  You pass clots bigger than 1 inch wide.  You have irregular periods that happen more or less often than once a month.  You feel dizzy or faint.  You feel very weak or tired.  You feel short of breath or feel your heart is beating too fast when you exercise.  You have  nausea and vomiting or diarrhea while you are taking your medicine.  You have any problems that may be related to the medicine you are taking. SEEK IMMEDIATE MEDICAL CARE IF:   You soak through 4 or more pads or tampons in 2 hours.  You have any bleeding while you are pregnant. MAKE SURE YOU:   Understand these instructions.  Will watch your condition.  Will get help right away if you are not doing well or get worse. Document Released: 04/14/2005 Document Revised: 02/02/2013 Document Reviewed: 10/03/2012 [facility] Patient Information 2014 [place].

## 2013-08-17 ENCOUNTER — Telehealth: Payer: Self-pay

## 2013-08-17 LAB — IPS PAP TEST WITH HPV

## 2013-08-17 NOTE — Telephone Encounter (Signed)
Lmtcb.

## 2013-08-17 NOTE — Telephone Encounter (Signed)
Message copied by Susy Manor on Wed Aug 17, 2013 10:23 AM ------      Message from: Regina Eck      Created: Wed Aug 17, 2013  8:24 AM       Please notify patient that pap smear unsatisfactory due to too many RBC for interpretation, we discussed this was a possibility      Needs to schedule repeat no on period      [facility] was not detected ------

## 2013-08-17 NOTE — Telephone Encounter (Signed)
Patient is calling joy back °

## 2013-08-17 NOTE — Telephone Encounter (Signed)
Patient notified of results as written by provider 

## 2013-08-17 NOTE — Telephone Encounter (Signed)
Left message for call back.

## 2013-08-19 NOTE — Progress Notes (Signed)
Please just plan for [facility].  Will precert after this once decision can be more definitive.  Reviewed personally.  Felipa Emory, MD.

## 2013-08-22 ENCOUNTER — Other Ambulatory Visit: Payer: Self-pay | Admitting: Certified Nurse Midwife

## 2013-08-22 DIAGNOSIS — N920 Excessive and frequent menstruation with regular cycle: Secondary | ICD-10-CM

## 2013-08-23 ENCOUNTER — Telehealth: Payer: Self-pay | Admitting: Gynecology

## 2013-08-23 NOTE — Telephone Encounter (Signed)
Spoke with patient. Advised of $5 copay quote received for [facility]. Scheduled SHGM. Advised patient of 72 hour cancellation policy and $751 cancellation fee. Patient agreeable.

## 2013-08-25 ENCOUNTER — Ambulatory Visit (INDEPENDENT_AMBULATORY_CARE_PROVIDER_SITE_OTHER): Payer: BC Managed Care – PPO | Admitting: Certified Nurse Midwife

## 2013-08-25 ENCOUNTER — Encounter: Payer: Self-pay | Admitting: Certified Nurse Midwife

## 2013-08-25 VITALS — BP 110/68 | HR 72 | Resp 16 | Ht 62.25 in | Wt 143.0 lb

## 2013-08-25 DIAGNOSIS — R87625 Unsatisfactory cytologic smear of vagina: Secondary | ICD-10-CM

## 2013-08-25 DIAGNOSIS — Z0000 Encounter for general adult medical examination without abnormal findings: Secondary | ICD-10-CM

## 2013-08-25 NOTE — Progress Notes (Signed)
.  48 y.o. [id number] Divorced Caucasian  F here for repeat Pap smear due to history of unsatisfactory pap due to blood on smear, patient was on menses . HPVHR was not detected on pap smear. Patient not having bleeding today. No health changes, coming in for PUS soon.  Patient's last menstrual period was 08/09/2013.Marland Kitchen    EXAM: BP 110/68  Pulse 72  Resp 16  Ht 5' 2.25" (1.581 m)  Wt 143 lb (64.864 kg)  BMI 25.95 kg/m2  LMP 08/09/2013 General appearance:  WNWD Female, NAD  Pelvic exam: normal external genitalia, vulva, vagina, cervix, uterus and adnexa, VULVA: normal appearing vulva with no masses, tenderness or lesions, VAGINA: normal appearing vagina with normal color and discharge, no lesions, CERVIX: flat in appearance anterior with a tiny stenotic os, no lesions, tilted to left, non tender Pap smear obtained.  Assessment: History of unsatisfactory pap smear due to bleeding  Plan: Pap smear obtained and sent to lab. Patient will be notified of result.

## 2013-08-29 LAB — IPS PAP SMEAR ONLY

## 2013-08-30 ENCOUNTER — Encounter: Payer: Self-pay | Admitting: Gynecology

## 2013-08-30 ENCOUNTER — Other Ambulatory Visit: Payer: Self-pay | Admitting: Gynecology

## 2013-08-30 ENCOUNTER — Ambulatory Visit (INDEPENDENT_AMBULATORY_CARE_PROVIDER_SITE_OTHER): Payer: BC Managed Care – PPO

## 2013-08-30 ENCOUNTER — Ambulatory Visit (INDEPENDENT_AMBULATORY_CARE_PROVIDER_SITE_OTHER): Payer: BC Managed Care – PPO | Admitting: Gynecology

## 2013-08-30 VITALS — BP 110/64 | Ht 62.25 in | Wt 142.0 lb

## 2013-08-30 DIAGNOSIS — N920 Excessive and frequent menstruation with regular cycle: Secondary | ICD-10-CM

## 2013-08-30 DIAGNOSIS — F1911 Other psychoactive substance abuse, in remission: Secondary | ICD-10-CM

## 2013-08-30 DIAGNOSIS — N840 Polyp of corpus uteri: Secondary | ICD-10-CM

## 2013-08-30 MED ORDER — MISOPROSTOL 200 MCG PO TABS: ORAL_TABLET | ORAL | Status: DC

## 2013-08-30 NOTE — Progress Notes (Signed)
      Pt here for PUS for menorrhagia and dysmenorrhea for 61m.   Images were reviewed with pt. Endometrial lining is thick with suspected mass. Ovaries are normal and no free fluid is seen SHG recommended. Risks and benefits reivewed and written consent obtained. Graves speculum placed.  Cervix noted to be scarred due to LEEP and nabothian cyst.  lidocaine jelly placed.  Cervix stabilized with single tooth tenaculum.  Os finder used.  Insemination catheter advanced with ease.  Walls gently distended.  Endometrial mass 2.7x2.2cm with single feeder vessel noted. findings reviewed.  Suggest D&C hysteroscopy.  Briefly reviewed procedure.  risk of bleeding, infection uterine perforation discussed. Anesthetic risks, fluid overload, DVT/PE risks. Pt agreeable questions addressed.  BP 110/64  Ht 5' 2.25" (1.581 m)  Wt 142 lb (64.411 kg)  BMI 25.77 kg/m2  LMP 08/09/2013 General appearance: alert, cooperative and appears stated age Lungs: clear to auscultation bilaterally Heart: regular rate and rhythm, S1, S2 normal, no murmur, click, rub or gallop Abdomen: soft, non-tender; bowel sounds normal; no masses,  no organomegaly Pelvic: cervix normal in appearance, external genitalia normal, no adnexal masses or tenderness, no cervical motion tenderness, uterus normal size, shape, and consistency and vagina normal without discharge  Will schedule cytotec will be used to prime cervix  42m spent in direct pt counseling, >50% face to face

## 2013-08-30 NOTE — Progress Notes (Signed)
Order changed from Regina Eck CNM to Dr. Charlies Constable for visit.

## 2013-08-31 DIAGNOSIS — N920 Excessive and frequent menstruation with regular cycle: Secondary | ICD-10-CM | POA: Insufficient documentation

## 2013-08-31 DIAGNOSIS — N840 Polyp of corpus uteri: Secondary | ICD-10-CM | POA: Insufficient documentation

## 2013-08-31 DIAGNOSIS — F1911 Other psychoactive substance abuse, in remission: Secondary | ICD-10-CM | POA: Insufficient documentation

## 2013-09-01 ENCOUNTER — Telehealth: Payer: Self-pay | Admitting: Gynecology

## 2013-09-01 NOTE — Telephone Encounter (Signed)
Patient calling with more questions for Sabrina.

## 2013-09-01 NOTE — Telephone Encounter (Signed)
Spoke with patient. Advised that per benefit quote receive, she will be responsible for $518.98 for surgeons fee. Explained that the payment will need to be paid in full at least 2 weeks prior to scheduled surgery date. Explained that she will receive separate correspondence from the hospital regarding their charges. Patient is anxious to have the surgery, but will first need to secure child care for her daughter and come up with the surgeons fee $ to be paid upfront. States that she will call back to discuss once these issues have been sorted out.

## 2013-09-01 NOTE — Telephone Encounter (Signed)
If she has private insurance, which she does, they do not discount at the clinic.  Not a better option for her.  Just FYI.

## 2013-09-01 NOTE — Telephone Encounter (Signed)
Patient request clarification on name of procedure and brief description. States she cannot afford out of pocket cost and needs to know if there is another option. Briefly advised that will check with provider.  Usual options for menorrhagia may not be helpful till endometrial polyp is removed.  Offered to refer to clinic at [facility] to see if will be less expensive and patient would like to try this.  Has no ability to pay OOP estimate of $500.  To Dr Sabra Heck since Dr Charlies Constable out of office.  CC: Dr Charlies Constable.

## 2013-09-02 NOTE — Telephone Encounter (Signed)
Patient calling with an update. She does not want to be referred to the clinic at [facility]. Her goal is to have the money saved by July so she can have the surgery.

## 2013-11-03 NOTE — Telephone Encounter (Addendum)
Patient has consult scheduled with Dr Sabra Heck on 11-22-13, she spoke to Dr Sabra Heck personally.  Routing to DR Sabra Heck for final review. I believe this is the patient you mentioned to me. Will hold any further scheduling till appointment 11-22-13.

## 2013-11-22 ENCOUNTER — Encounter: Payer: Self-pay | Admitting: Obstetrics & Gynecology

## 2013-11-22 ENCOUNTER — Ambulatory Visit (INDEPENDENT_AMBULATORY_CARE_PROVIDER_SITE_OTHER): Payer: BC Managed Care – PPO | Admitting: Obstetrics & Gynecology

## 2013-11-22 VITALS — BP 102/60 | HR 60 | Ht 62.25 in | Wt 138.0 lb

## 2013-11-22 DIAGNOSIS — N840 Polyp of corpus uteri: Secondary | ICD-10-CM

## 2013-11-22 DIAGNOSIS — N920 Excessive and frequent menstruation with regular cycle: Secondary | ICD-10-CM

## 2013-11-22 NOTE — Progress Notes (Deleted)
Patient ID: Pamela Pham, female   DOB: [birth year], 48 y.o.   MRN: [id number]  5-7 days, heavy for 5 days.  Uses always pad changes every 2 hours.  Passes clots.

## 2013-11-29 ENCOUNTER — Telehealth: Payer: Self-pay | Admitting: *Deleted

## 2013-11-29 NOTE — Telephone Encounter (Signed)
Call to patient to discuss surgery options. Patient anxious to proceed and with first available date. Advised option of 12-19-13 or late into September. Patient prefers August 24. Will schedule and call her back.

## 2013-11-29 NOTE — Progress Notes (Signed)
Subjective:     Patient ID: Pamela Pham, female   DOB: 1965-06-27, 48 y.o.   MRN: [id number]  HPI 48 yo G3 P1021 here for discussion of possible hysterectomy.  Pt has been experiencing heavy flow for years.  Currently flow lasts 5-7 days, heavy for 5 days.  She often passes clots.  Doesn't use tampons but uses long and pads for heavy flow.  Does sometime soil clothes.  Cramping is not bad.  Doesn't use pain medications unless necessary.   Pt seen by Dr. Charlies Constable for the bleeding on 08/30/13.  Uterus measures 8.4 x 5.8 x 4.7cm.  Endometrium was thickened to 74mm showing 2.7cm polyp.  Hysteroscopic polyp resection was discussed.  She really wants to be done with bleeding and is desirous of hysterectomy.  Pt really has considered options and has talked to several individuals who had hysteroscopy performed.  Most of them reported bleeding was improved for a short while and then worsened again.  She absolutely does not want this.     Pt reports hx of drug addiction. She has been clean for several years.  Lived at [facility] during this process.  She reports she does not want pain medication for anything unless absolutely necessary and she only wants it for the minimum days necessary.    Procedure discussed with patient.  Hospital stay, recovery and pain management all discussed.  Risks discussed including but not limited to bleeding, 1% risk of receiving a  transfusion, infection, 3-4% risk of bowel/bladder/ureteral/vascular injury discussed as well as possible need for additional surgery if injury does occur discussed.  DVT/PE and rare risk of death discussed.  My actual complications with prior surgeries discussed.  Vaginal cuff dehiscence discussed.  Hernia formation discussed.  Positioning and incision locations discussed.  Patient aware if pathology abnormal she may need additional treatment.  All questions answered.    Review of Systems  All other systems reviewed and are negative.       Objective:   Physical Exam  Constitutional: She appears well-developed and well-nourished.  Abdominal: Soft. Bowel sounds are normal.  Genitourinary: Vagina normal. There is no rash or tenderness on the right labia. There is no rash or tenderness on the left labia. Uterus is not enlarged (slightly) and not fixed. Cervix exhibits no motion tenderness. Right adnexum displays no mass and no tenderness. Left adnexum displays no mass and no tenderness.  Lymphadenopathy:       Right: No inguinal adenopathy present.       Left: No inguinal adenopathy present.       Assessment:     48 yo G1021 SWF with menorrhagia, uterine polyp who is desirous of hysterectomy     Plan:     Plan robotic assisted TLH/bilateral salpingectomy/possible BSO as pt convenience.  ~20 minutes spent with patient >50% of time was in face to face discussion of above.

## 2013-12-01 NOTE — Telephone Encounter (Signed)
Spoke with patient. Advised that per benefit quote received, she will be responsible for $777.09 for the surgeons portion of her surgery. Advised that per office policy, payment is due at least 2 weeks prior to the scheduled surgery date. Payment would be due 12/05/2013. Per patient and per note received from Gay Filler, it is ok to set patient up on a payment plan. When patient was asked how much she could pay, she stated that she can pay $50 twice a month (once on the 15th and once on the 30th). I advised patient that office administration and billing will work together to decide what payment options are available and that administration would contact her.

## 2013-12-06 NOTE — Telephone Encounter (Signed)
Spoke with patient. Advised that check should be written to [facility]. Advised that we will bill her for the surgery, but if she intends to pay $100 per month she can. Advised that surgery is scheduled for 08.24.2015 @ 0730. Advised that Gay Filler will contact her with surgery instructions. Patient agreeable.

## 2013-12-06 NOTE — Telephone Encounter (Signed)
Spoke with patient. Patient states that she was waiting to hear about about payment options. "My parents are going to write a check for $100 and I need to know who to make it out to." Advised patient that I would speak with billing and administration and have someone call her back with further information. Patient agreeable.

## 2013-12-06 NOTE — Telephone Encounter (Signed)
Pt is calling to talk with a nurse no information given.

## 2013-12-06 NOTE — Telephone Encounter (Signed)
Pamela Pham, please call patient to discuss payment details and what she would like to set up.

## 2013-12-12 ENCOUNTER — Encounter (HOSPITAL_COMMUNITY): Payer: Self-pay

## 2013-12-12 ENCOUNTER — Encounter (HOSPITAL_COMMUNITY)
Admission: RE | Admit: 2013-12-12 | Discharge: 2013-12-12 | Disposition: A | Discharge status: Home / Self Care | Payer: BC Managed Care – PPO | Source: Ambulatory Visit | Attending: Obstetrics & Gynecology | Admitting: Obstetrics & Gynecology

## 2013-12-12 DIAGNOSIS — Z01818 Encounter for other preprocedural examination: Secondary | ICD-10-CM | POA: Insufficient documentation

## 2013-12-12 DIAGNOSIS — N920 Excessive and frequent menstruation with regular cycle: Secondary | ICD-10-CM | POA: Diagnosis not present

## 2013-12-12 LAB — CBC
HCT: 38.4 % (ref 36.0–46.0)
HEMOGLOBIN: 13 g/dL (ref 12.0–15.0)
MCH: 29.8 pg (ref 26.0–34.0)
MCHC: 33.9 g/dL (ref 30.0–36.0)
MCV: 88.1 fL (ref 78.0–100.0)
Platelets: 294 10*3/uL (ref 150–400)
RBC: 4.36 MIL/uL (ref 3.87–5.11)
RDW: 15.2 % (ref 11.5–15.5)
WBC: 8.9 10*3/uL (ref 4.0–10.5)

## 2013-12-12 NOTE — Patient Instructions (Signed)
Your procedure is scheduled on:12/19/13  Enter through the Main Entrance at : 0645 am Pick up desk phone and dial [phone] and inform us of your arrival.  Please call [phone] if you have any problems the morning of surgery.  Remember: Do not eat food or drink liquids, including water, after midnight:Sunday   You may brush your teeth the morning of surgery.  Take these meds the morning of surgery with a sip of water:Synthroid  DO NOT wear jewelry, eye make-up, lipstick,body lotion, or dark fingernail polish.  (Polished toes are ok) You may wear deodorant.  If you are to be admitted after surgery, leave suitcase in car until your room has been assigned. Patients discharged on the day of surgery will not be allowed to drive home. Wear loose fitting, comfortable clothes for your ride home.

## 2013-12-14 ENCOUNTER — Telehealth: Payer: Self-pay | Admitting: *Deleted

## 2013-12-14 NOTE — Telephone Encounter (Signed)
Call to patient and reviewed surgery instruction sheet. Will pick up written instruction form at appt, surgery consult scheduled for 12-16-13. Confirmed surgery scheduled for 0730 and needs to arrive at 6am unless hospital staff directs her to come earlier. Patient agreeable.  Routing to provider for final review. Patient agreeable to disposition. Will close encounter

## 2013-12-16 ENCOUNTER — Ambulatory Visit (INDEPENDENT_AMBULATORY_CARE_PROVIDER_SITE_OTHER): Payer: BC Managed Care – PPO | Admitting: Obstetrics & Gynecology

## 2013-12-16 ENCOUNTER — Encounter: Payer: Self-pay | Admitting: Obstetrics & Gynecology

## 2013-12-16 VITALS — BP 118/64 | HR 68 | Resp 20 | Ht 62.25 in | Wt 139.6 lb

## 2013-12-16 DIAGNOSIS — N920 Excessive and frequent menstruation with regular cycle: Secondary | ICD-10-CM

## 2013-12-16 DIAGNOSIS — N840 Polyp of corpus uteri: Secondary | ICD-10-CM

## 2013-12-16 MED ORDER — IBUPROFEN 800 MG PO TABS: 800.0000 mg | ORAL_TABLET | Freq: Three times a day (TID) | ORAL | Status: DC | PRN

## 2013-12-16 MED ORDER — HYDROCODONE-ACETAMINOPHEN 5-300 MG PO TABS: 1.0000 | ORAL_TABLET | Freq: Four times a day (QID) | ORAL | Status: DC

## 2013-12-16 NOTE — Progress Notes (Signed)
48 y.o. [id number] DivorcedCaucasian female here for discussion of upcoming procedure.  Robotic TLH planned due to menorrhagia and endometrial polyps.  Pre-op evaluation thus far has included PUS. Uterus 8.4 x 5.8 x 4.7cm with 27 x 27mm porbable endometrial polyp.  She has been counselled about hysteroscopy with polyp resection but she is ready to be done with bleeding and she is afraid she will do that procedure and need to proceed further.  Of course, I cannot guarantee that she will not need additional surgery after a hysteroscopy.  She has decided to proceed with definitive management.    Procedure discussed with patient.  Hospital stay, recovery and pain management all discussed.  Risks discussed including but not limited to bleeding, 1% risk of receiving a  transfusion, infection, 3-4% risk of bowel/bladder/ureteral/vascular injury discussed as well as possible need for additional surgery if injury does occur discussed.  DVT/PE and rare risk of death discussed.  My actual complications with prior surgeries discussed.  Vaginal cuff dehiscence discussed.  Hernia formation discussed.  Positioning and incision locations discussed.  Patient aware if pathology abnormal she may need additional treatment.  All questions answered.    Ob Hx:   Patient's last menstrual period was 12/02/2013.          Sexually active: Yes.   Birth control: vasectomy Last pap: 08/25/13 WNL Last MMG: 07/26/13 Tobacco: smokes 1ppd  Past Medical History  Diagnosis Date  . Hypothyroidism   . HOH (hard of hearing)     right ear  . Anxiety   . Depression   . Abnormal Pap smear of cervix   . Migraines   . Asthma   . Dysmenorrhea   . Substance abuse     recovering addict   Pt has iliac artery stent placed by Dr. Sherren Mocha Early 20 years ago due to bluish toes.  When she describes this, she states it accessed like a cardiac catheterization but the vascular issue was in her leg.  She was never on blood thinners and stopped OCPs then.   She has never had an issue with this since.  This sounds more like a stent in her femoral artery.    Allergies: Sulfa antibiotics  Current Outpatient Prescriptions  Medication Sig Dispense Refill  . Albuterol Sulfate (PROAIR HFA IN) Inhale into the lungs as needed.      . ATORVASTATIN CALCIUM PO Take by mouth daily.      . clonazePAM (KLONOPIN) 0.5 MG tablet daily.      Marland Kitchen levothyroxine (SYNTHROID, LEVOTHROID) 112 MCG tablet Take 112 mcg by mouth daily before breakfast.      . mirtazapine (REMERON) 30 MG tablet at bedtime.       No current facility-administered medications for this visit.    ROS: A comprehensive review of systems was negative.  Exam:   BP 118/64  Pulse 68  Resp 20  Ht 5' 2.25" (1.581 m)  Wt 139 lb 9.6 oz (63.322 kg)  BMI 25.33 kg/m2  LMP 12/02/2013  General appearance: alert and cooperative Head: Normocephalic, without obvious abnormality, atraumatic Neck: no adenopathy, supple, symmetrical, trachea midline and thyroid not enlarged, symmetric, no tenderness/mass/nodules Lungs: clear to auscultation bilaterally Heart: regular rate and rhythm, S1, S2 normal, no murmur, click, rub or gallop Abdomen: soft, non-tender; bowel sounds normal; no masses,  no organomegaly Extremities: extremities normal, atraumatic, no cyanosis or edema Skin: Skin color, texture, turgor normal. No rashes or lesions Lymph nodes: Cervical, supraclavicular, and axillary nodes normal. no inguinal nodes  palpated Neurologic: Grossly normal  Pelvic: External genitalia:  no lesions              Urethra: normal appearing urethra with no masses, tenderness or lesions              Bartholins and Skenes: normal                 Vagina: normal appearing vagina with normal color and discharge, no lesions              Cervix: normal appearance              Pap taken: No.        Bimanual Exam:  Uterus:  uterus is normal size, shape, consistency and nontender                                       Adnexa:    normal adnexa in size, nontender and no masses                                      Rectovaginal: Deferred                                      Anus:  normal sphincter tone, no lesions  A: Menorrhagia Endometrial polyp H/O substance abuse, clean for     P:  Robotic TLH/Bilateral salpingectomy with possible BSO planned Rx for Motrin and Vicodin 5/300 1-2 tabs every 4-6 hrs given.  #15 tabs.  Pt and I both know I am going to be very careful with how many of these she receives.  Medications/Vitamins reviewed.   Hysterectomy brochure given for pre and post op instructions.  ~20 minutes spent with patient >50% of time was in face to face discussion of above.

## 2013-12-18 NOTE — Anesthesia Preprocedure Evaluation (Addendum)
Anesthesia Evaluation  Patient identified by MRN, date of birth, ID band  Reviewed: Allergy & Precautions, H&P , NPO status , Patient's Chart, lab work & pertinent test results  Airway Mallampati: I      Dental no notable dental hx.    Pulmonary asthma , Current Smoker,    Pulmonary exam normal       Cardiovascular + Peripheral Vascular Disease (h/o iliac artery stent 19 years ago)     Neuro/Psych  Headaches, PSYCHIATRIC DISORDERS Anxiety Depression    GI/Hepatic negative GI ROS, (+)     substance abuse (h/o cocaine abuse - clean x6 years)  cocaine use,   Endo/Other  Hypothyroidism   Renal/GU negative Renal ROS  Female GU complaint     Musculoskeletal   Abdominal Normal abdominal exam  (+)   Peds  Hematology   Anesthesia Other Findings   Reproductive/Obstetrics negative OB ROS                          Anesthesia Physical Anesthesia Plan  ASA: II  Anesthesia Plan: General ETT   Post-op Pain Management:    Induction:   Airway Management Planned: Oral ETT  Additional Equipment:   Intra-op Plan:   Post-operative Plan: Extubation in OR  Informed Consent: I have reviewed the patients History and Physical, chart, labs and discussed the procedure including the risks, benefits and alternatives for the proposed anesthesia with the patient or authorized representative who has indicated his/her understanding and acceptance.   Dental advisory given  Plan Discussed with: Surgeon and CRNA  Anesthesia Plan Comments: (Discussed with Dr Sabra Heck)       Anesthesia Quick Evaluation

## 2013-12-19 ENCOUNTER — Encounter (HOSPITAL_COMMUNITY)
Admission: RE | Disposition: A | Discharge status: Home / Self Care | Payer: Self-pay | Source: Ambulatory Visit | Attending: Obstetrics & Gynecology

## 2013-12-19 ENCOUNTER — Encounter (HOSPITAL_COMMUNITY): Payer: Self-pay | Admitting: Anesthesiology

## 2013-12-19 ENCOUNTER — Ambulatory Visit (HOSPITAL_COMMUNITY): Payer: BC Managed Care – PPO | Admitting: Anesthesiology

## 2013-12-19 ENCOUNTER — Encounter (HOSPITAL_COMMUNITY): Payer: BC Managed Care – PPO | Admitting: Anesthesiology

## 2013-12-19 ENCOUNTER — Ambulatory Visit (HOSPITAL_COMMUNITY)
Admission: RE | Admit: 2013-12-19 | Discharge: 2013-12-19 | Disposition: A | Discharge status: Home / Self Care | Payer: BC Managed Care – PPO | Source: Ambulatory Visit | Attending: Obstetrics & Gynecology | Admitting: Obstetrics & Gynecology

## 2013-12-19 DIAGNOSIS — N8000 Endometriosis of the uterus, unspecified: Secondary | ICD-10-CM

## 2013-12-19 DIAGNOSIS — F17200 Nicotine dependence, unspecified, uncomplicated: Secondary | ICD-10-CM | POA: Insufficient documentation

## 2013-12-19 DIAGNOSIS — E039 Hypothyroidism, unspecified: Secondary | ICD-10-CM | POA: Diagnosis not present

## 2013-12-19 DIAGNOSIS — Z8249 Family history of ischemic heart disease and other diseases of the circulatory system: Secondary | ICD-10-CM | POA: Insufficient documentation

## 2013-12-19 DIAGNOSIS — F1410 Cocaine abuse, uncomplicated: Secondary | ICD-10-CM | POA: Diagnosis not present

## 2013-12-19 DIAGNOSIS — N871 Moderate cervical dysplasia: Secondary | ICD-10-CM

## 2013-12-19 DIAGNOSIS — Z833 Family history of diabetes mellitus: Secondary | ICD-10-CM | POA: Diagnosis not present

## 2013-12-19 DIAGNOSIS — N840 Polyp of corpus uteri: Secondary | ICD-10-CM | POA: Insufficient documentation

## 2013-12-19 DIAGNOSIS — D251 Intramural leiomyoma of uterus: Secondary | ICD-10-CM

## 2013-12-19 DIAGNOSIS — F341 Dysthymic disorder: Secondary | ICD-10-CM | POA: Insufficient documentation

## 2013-12-19 DIAGNOSIS — Z823 Family history of stroke: Secondary | ICD-10-CM | POA: Diagnosis not present

## 2013-12-19 DIAGNOSIS — N946 Dysmenorrhea, unspecified: Secondary | ICD-10-CM | POA: Insufficient documentation

## 2013-12-19 DIAGNOSIS — N920 Excessive and frequent menstruation with regular cycle: Secondary | ICD-10-CM | POA: Diagnosis not present

## 2013-12-19 HISTORY — PX: ROBOTIC ASSISTED TOTAL HYSTERECTOMY: SHX6085

## 2013-12-19 HISTORY — PX: CYSTOSCOPY: SHX5120

## 2013-12-19 LAB — HEMOGLOBIN: Hemoglobin: 11.9 g/dL — ABNORMAL LOW (ref 12.0–15.0)

## 2013-12-19 LAB — PREGNANCY, URINE: Preg Test, Ur: NEGATIVE

## 2013-12-19 SURGERY — ROBOTIC ASSISTED TOTAL HYSTERECTOMY
Anesthesia: General | Site: Bladder

## 2013-12-19 MED ORDER — KETOROLAC TROMETHAMINE 30 MG/ML IJ SOLN
INTRAMUSCULAR | Status: DC | PRN
  Administered 2013-12-19 (×2): 15 mg via INTRAVENOUS

## 2013-12-19 MED ORDER — FENTANYL CITRATE 0.05 MG/ML IJ SOLN
INTRAMUSCULAR | Status: DC | PRN
  Administered 2013-12-19 (×5): 50 ug via INTRAVENOUS

## 2013-12-19 MED ORDER — FENTANYL CITRATE 0.05 MG/ML IJ SOLN
INTRAMUSCULAR | Status: AC
  Filled 2013-12-19: qty 5

## 2013-12-19 MED ORDER — DEXAMETHASONE SODIUM PHOSPHATE 10 MG/ML IJ SOLN
INTRAMUSCULAR | Status: AC
  Filled 2013-12-19: qty 1

## 2013-12-19 MED ORDER — MORPHINE SULFATE 4 MG/ML IJ SOLN: 2.0000 mg | INTRAMUSCULAR | Status: DC | PRN

## 2013-12-19 MED ORDER — MEPERIDINE HCL 25 MG/ML IJ SOLN: 6.2500 mg | INTRAMUSCULAR | Status: DC | PRN

## 2013-12-19 MED ORDER — ACETAMINOPHEN 10 MG/ML IV SOLN
1000.0000 mg | Freq: Once | INTRAVENOUS | Status: AC
  Administered 2013-12-19: 1000 mg via INTRAVENOUS
  Filled 2013-12-19: qty 100

## 2013-12-19 MED ORDER — SODIUM CHLORIDE 0.9 % IJ SOLN
INTRAMUSCULAR | Status: AC
  Filled 2013-12-19: qty 50

## 2013-12-19 MED ORDER — SIMETHICONE 80 MG PO CHEW
80.0000 mg | CHEWABLE_TABLET | Freq: Four times a day (QID) | ORAL | Status: DC | PRN
Start: 2013-12-19 — End: 2013-12-19

## 2013-12-19 MED ORDER — SODIUM CHLORIDE 0.9 % IJ SOLN
INTRAMUSCULAR | Status: AC
  Filled 2013-12-19: qty 10

## 2013-12-19 MED ORDER — ROCURONIUM BROMIDE 100 MG/10ML IV SOLN
INTRAVENOUS | Status: DC | PRN
  Administered 2013-12-19: 10 mg via INTRAVENOUS
  Administered 2013-12-19: 50 mg via INTRAVENOUS

## 2013-12-19 MED ORDER — ACETAMINOPHEN 325 MG PO TABS
650.0000 mg | ORAL_TABLET | ORAL | Status: DC | PRN
  Administered 2013-12-19: 650 mg via ORAL
  Filled 2013-12-19: qty 2

## 2013-12-19 MED ORDER — GLYCOPYRROLATE 0.2 MG/ML IJ SOLN
INTRAMUSCULAR | Status: AC
  Filled 2013-12-19: qty 3

## 2013-12-19 MED ORDER — STERILE WATER FOR IRRIGATION IR SOLN
Status: DC | PRN
  Administered 2013-12-19: 1000 mL via INTRAVESICAL

## 2013-12-19 MED ORDER — KETOROLAC TROMETHAMINE 30 MG/ML IJ SOLN: 15.0000 mg | Freq: Once | INTRAMUSCULAR | Status: DC | PRN

## 2013-12-19 MED ORDER — GLYCOPYRROLATE 0.2 MG/ML IJ SOLN
INTRAMUSCULAR | Status: DC | PRN
  Administered 2013-12-19: 0.4 mg via INTRAVENOUS

## 2013-12-19 MED ORDER — HYDROMORPHONE HCL PF 1 MG/ML IJ SOLN
INTRAMUSCULAR | Status: AC
  Administered 2013-12-19: 0.5 mg via INTRAVENOUS
  Filled 2013-12-19: qty 1

## 2013-12-19 MED ORDER — LIDOCAINE HCL (CARDIAC) 20 MG/ML IV SOLN
INTRAVENOUS | Status: DC | PRN
  Administered 2013-12-19: 60 mg via INTRAVENOUS

## 2013-12-19 MED ORDER — CLONAZEPAM 0.5 MG PO TABS: 0.5000 mg | ORAL_TABLET | Freq: Two times a day (BID) | ORAL | Status: DC | PRN

## 2013-12-19 MED ORDER — ROPIVACAINE HCL 5 MG/ML IJ SOLN
INTRAMUSCULAR | Status: DC | PRN
  Administered 2013-12-19: 71 mL

## 2013-12-19 MED ORDER — LACTATED RINGERS IR SOLN
Status: DC | PRN
  Administered 2013-12-19: 3000 mL

## 2013-12-19 MED ORDER — PROPOFOL 10 MG/ML IV EMUL
INTRAVENOUS | Status: AC
  Filled 2013-12-19: qty 20

## 2013-12-19 MED ORDER — DEXTROSE 5 % IV SOLN
2.0000 g | INTRAVENOUS | Status: AC
  Administered 2013-12-19: 2 g via INTRAVENOUS
  Filled 2013-12-19: qty 2

## 2013-12-19 MED ORDER — LACTATED RINGERS IV SOLN
INTRAVENOUS | Status: DC
  Administered 2013-12-19 (×4): via INTRAVENOUS

## 2013-12-19 MED ORDER — TRAMADOL HCL 50 MG PO TABS
100.0000 mg | ORAL_TABLET | Freq: Four times a day (QID) | ORAL | Status: DC
  Administered 2013-12-19: 100 mg via ORAL
  Filled 2013-12-19: qty 2

## 2013-12-19 MED ORDER — ROPIVACAINE HCL 5 MG/ML IJ SOLN
INTRAMUSCULAR | Status: AC
  Filled 2013-12-19: qty 60

## 2013-12-19 MED ORDER — MIDAZOLAM HCL 2 MG/2ML IJ SOLN
INTRAMUSCULAR | Status: DC | PRN
  Administered 2013-12-19: 2 mg via INTRAVENOUS

## 2013-12-19 MED ORDER — HYDROMORPHONE HCL PF 1 MG/ML IJ SOLN
0.2500 mg | INTRAMUSCULAR | Status: DC | PRN
  Administered 2013-12-19 (×2): 0.5 mg via INTRAVENOUS

## 2013-12-19 MED ORDER — TRAMADOL HCL 50 MG PO TABS: 100.0000 mg | ORAL_TABLET | Freq: Four times a day (QID) | ORAL | Status: DC

## 2013-12-19 MED ORDER — DEXAMETHASONE SODIUM PHOSPHATE 10 MG/ML IJ SOLN
INTRAMUSCULAR | Status: DC | PRN
  Administered 2013-12-19: 5 mg via INTRAVENOUS

## 2013-12-19 MED ORDER — HYDROCODONE-ACETAMINOPHEN 5-325 MG PO TABS: 1.0000 | ORAL_TABLET | ORAL | Status: DC | PRN

## 2013-12-19 MED ORDER — NEOSTIGMINE METHYLSULFATE 10 MG/10ML IV SOLN
INTRAVENOUS | Status: AC
  Filled 2013-12-19: qty 1

## 2013-12-19 MED ORDER — LIDOCAINE HCL (CARDIAC) 20 MG/ML IV SOLN
INTRAVENOUS | Status: AC
  Filled 2013-12-19: qty 5

## 2013-12-19 MED ORDER — KETOROLAC TROMETHAMINE 30 MG/ML IJ SOLN
15.0000 mg | Freq: Four times a day (QID) | INTRAMUSCULAR | Status: DC
  Filled 2013-12-19: qty 1

## 2013-12-19 MED ORDER — KETOROLAC TROMETHAMINE 30 MG/ML IJ SOLN
INTRAMUSCULAR | Status: AC
  Filled 2013-12-19: qty 1

## 2013-12-19 MED ORDER — ALBUTEROL SULFATE (2.5 MG/3ML) 0.083% IN NEBU
INHALATION_SOLUTION | RESPIRATORY_TRACT | Status: DC | PRN
Start: 2013-12-19 — End: 2013-12-19

## 2013-12-19 MED ORDER — PROPOFOL 10 MG/ML IV BOLUS
INTRAVENOUS | Status: DC | PRN
  Administered 2013-12-19: 150 mg via INTRAVENOUS

## 2013-12-19 MED ORDER — LEVOTHYROXINE SODIUM 112 MCG PO TABS
112.0000 ug | ORAL_TABLET | Freq: Every day | ORAL | Status: DC
  Filled 2013-12-19: qty 1

## 2013-12-19 MED ORDER — KETOROLAC TROMETHAMINE 30 MG/ML IJ SOLN
15.0000 mg | Freq: Four times a day (QID) | INTRAMUSCULAR | Status: DC
  Administered 2013-12-19: 15 mg via INTRAVENOUS

## 2013-12-19 MED ORDER — PROMETHAZINE HCL 25 MG/ML IJ SOLN: 6.2500 mg | INTRAMUSCULAR | Status: DC | PRN

## 2013-12-19 MED ORDER — SCOPOLAMINE 1 MG/3DAYS TD PT72
1.0000 | MEDICATED_PATCH | Freq: Once | TRANSDERMAL | Status: DC
  Administered 2013-12-19: 1.5 mg via TRANSDERMAL

## 2013-12-19 MED ORDER — PANTOPRAZOLE SODIUM 40 MG IV SOLR
40.0000 mg | Freq: Every day | INTRAVENOUS | Status: DC
  Filled 2013-12-19: qty 40

## 2013-12-19 MED ORDER — ONDANSETRON HCL 4 MG/2ML IJ SOLN
INTRAMUSCULAR | Status: AC
  Filled 2013-12-19: qty 2

## 2013-12-19 MED ORDER — ARTIFICIAL TEARS OP OINT
TOPICAL_OINTMENT | OPHTHALMIC | Status: AC
  Filled 2013-12-19: qty 3.5

## 2013-12-19 MED ORDER — MENTHOL 3 MG MT LOZG: 1.0000 | LOZENGE | OROMUCOSAL | Status: DC | PRN

## 2013-12-19 MED ORDER — SCOPOLAMINE 1 MG/3DAYS TD PT72
MEDICATED_PATCH | TRANSDERMAL | Status: AC
  Administered 2013-12-19: 1.5 mg via TRANSDERMAL
  Filled 2013-12-19: qty 1

## 2013-12-19 MED ORDER — MIRTAZAPINE 30 MG PO TABS
30.0000 mg | ORAL_TABLET | Freq: Every day | ORAL | Status: DC
  Filled 2013-12-19: qty 1

## 2013-12-19 MED ORDER — ONDANSETRON HCL 4 MG/2ML IJ SOLN
INTRAMUSCULAR | Status: DC | PRN
Start: 2013-12-19 — End: 2013-12-19
  Administered 2013-12-19: 4 mg via INTRAVENOUS

## 2013-12-19 MED ORDER — MIDAZOLAM HCL 2 MG/2ML IJ SOLN
INTRAMUSCULAR | Status: AC
  Filled 2013-12-19: qty 2

## 2013-12-19 MED ORDER — DEXTROSE-NACL 5-0.45 % IV SOLN
INTRAVENOUS | Status: DC
  Administered 2013-12-19: 14:00:00 via INTRAVENOUS

## 2013-12-19 MED ORDER — ROCURONIUM BROMIDE 100 MG/10ML IV SOLN
INTRAVENOUS | Status: AC
  Filled 2013-12-19: qty 1

## 2013-12-19 MED ORDER — ALUM & MAG HYDROXIDE-SIMETH 200-200-20 MG/5ML PO SUSP: 30.0000 mL | ORAL | Status: DC | PRN

## 2013-12-19 MED ORDER — NEOSTIGMINE METHYLSULFATE 10 MG/10ML IV SOLN
INTRAVENOUS | Status: DC | PRN
  Administered 2013-12-19: 2 mg via INTRAVENOUS

## 2013-12-19 SURGICAL SUPPLY — 53 items
ADH SKN CLS APL DERMABOND .7 (GAUZE/BANDAGES/DRESSINGS) ×2
APL SKNCLS STERI-STRIP NONHPOA (GAUZE/BANDAGES/DRESSINGS)
BARRIER ADHS 3X4 INTERCEED (GAUZE/BANDAGES/DRESSINGS) ×3 IMPLANT
BENZOIN TINCTURE PRP APPL 2/3 (GAUZE/BANDAGES/DRESSINGS) ×2 IMPLANT
BRR ADH 4X3 ABS CNTRL BYND (GAUZE/BANDAGES/DRESSINGS) ×2
CLOTH BEACON ORANGE TIMEOUT ST (SAFETY) ×3 IMPLANT
CONT PATH 16OZ SNAP LID 3702 (MISCELLANEOUS) ×3 IMPLANT
COVER TABLE BACK 60X90 (DRAPES) ×6 IMPLANT
COVER TIP SHEARS 8 DVNC (MISCELLANEOUS) ×2 IMPLANT
COVER TIP SHEARS 8MM DA VINCI (MISCELLANEOUS) ×1
DECANTER SPIKE VIAL GLASS SM (MISCELLANEOUS) ×6 IMPLANT
DERMABOND ADVANCED (GAUZE/BANDAGES/DRESSINGS) ×1
DERMABOND ADVANCED .7 DNX12 (GAUZE/BANDAGES/DRESSINGS) ×2 IMPLANT
DRAPE HUG U DISPOSABLE (DRAPE) ×3 IMPLANT
DRAPE WARM FLUID 44X44 (DRAPE) ×3 IMPLANT
DURAPREP 26ML APPLICATOR (WOUND CARE) ×3 IMPLANT
ELECT REM PT RETURN 9FT ADLT (ELECTROSURGICAL) ×3
ELECTRODE REM PT RTRN 9FT ADLT (ELECTROSURGICAL) ×2 IMPLANT
EVACUATOR SMOKE 8.L (FILTER) ×3 IMPLANT
GAUZE VASELINE 3X9 (GAUZE/BANDAGES/DRESSINGS) IMPLANT
GLOVE BIOGEL PI IND STRL 7.0 (GLOVE) ×8 IMPLANT
GLOVE BIOGEL PI INDICATOR 7.0 (GLOVE) ×6
GLOVE ECLIPSE 6.5 STRL STRAW (GLOVE) ×12 IMPLANT
GOWN STRL REUS W/TWL LRG LVL3 (GOWN DISPOSABLE) ×30 IMPLANT
KIT ACCESSORY DA VINCI DISP (KITS) ×1
KIT ACCESSORY DVNC DISP (KITS) ×2 IMPLANT
LEGGING LITHOTOMY PAIR STRL (DRAPES) ×3 IMPLANT
NEEDLE INSUFFLATION 120MM (ENDOMECHANICALS) ×3 IMPLANT
OCCLUDER COLPOPNEUMO (BALLOONS) ×1 IMPLANT
PACK ROBOT WH (CUSTOM PROCEDURE TRAY) ×3 IMPLANT
PAD PREP 24X48 CUFFED NSTRL (MISCELLANEOUS) ×6 IMPLANT
PROTECTOR NERVE ULNAR (MISCELLANEOUS) ×6 IMPLANT
SET CYSTO W/LG BORE CLAMP LF (SET/KITS/TRAYS/PACK) ×3 IMPLANT
SET IRRIG TUBING LAPAROSCOPIC (IRRIGATION / IRRIGATOR) ×3 IMPLANT
STRIP CLOSURE SKIN 1/4X4 (GAUZE/BANDAGES/DRESSINGS) ×2 IMPLANT
SUT VIC AB 0 CT1 27 (SUTURE) ×9
SUT VIC AB 0 CT1 27XBRD ANBCTR (SUTURE) ×10 IMPLANT
SUT VICRYL 0 UR6 27IN ABS (SUTURE) ×3 IMPLANT
SUT VICRYL RAPIDE 4/0 PS 2 (SUTURE) ×6 IMPLANT
SYRINGE 60CC LL (MISCELLANEOUS) ×4 IMPLANT
TIP RUMI ORANGE 6.7MMX12CM (TIP) IMPLANT
TIP UTERINE 5.1X6CM LAV DISP (MISCELLANEOUS) IMPLANT
TIP UTERINE 6.7X10CM GRN DISP (MISCELLANEOUS) IMPLANT
TIP UTERINE 6.7X6CM WHT DISP (MISCELLANEOUS) IMPLANT
TIP UTERINE 6.7X8CM BLUE DISP (MISCELLANEOUS) ×1 IMPLANT
TOWEL OR 17X24 6PK STRL BLUE (TOWEL DISPOSABLE) ×7 IMPLANT
TRAY FOLEY BAG SILVER LF 14FR (CATHETERS) ×3 IMPLANT
TROCAR DILATING TIP 12MM 150MM (ENDOMECHANICALS) ×3 IMPLANT
TROCAR DISP BLADELESS 8 DVNC (TROCAR) ×2 IMPLANT
TROCAR DISP BLADELESS 8MM (TROCAR) ×1
TROCAR XCEL NON-BLD 5MMX100MML (ENDOMECHANICALS) ×3 IMPLANT
WARMER LAPAROSCOPE (MISCELLANEOUS) ×3 IMPLANT
WATER STERILE IRR 1000ML POUR (IV SOLUTION) ×9 IMPLANT

## 2013-12-19 NOTE — Discharge Summary (Signed)
Physician Discharge Summary  Patient ID: Pamela Pham MRN: [id number] DOB/AGE: 10/14/1965 48 y.o.  Admit date: 12/19/2013 Discharge date: 12/19/2013  Admission Diagnoses: menorrhagia, endometrial polyp, h/o prior cesarean section  Discharge Diagnoses:  Active Problems:   Menorrhagia   Discharged Condition: good  Hospital Course: Patient admitted through same day surgery.  She was taken to OR where Robotic TLH/bilateral salpingectomy/cystoscopy and peritoneal biopsy was taken were performed.  Surgical findings included endometriotic implants on right uterosacral ligament and left pelvic sidewall implants, filmy adhesions of colon to pelvic sidewall, bulky uterus, and bladder adhesions to lower uterine segment.  Surgery was uneventful.  EBL 35cc.  Foley catheter was removed before leaving OR.  Patient transferred to PACU where she was stable and then to 3rd floor for the remainder of her hospitalization.  During her post-op recovery, her vitals and stable and she was AF.  Her foley catheter was removed and she was able to void without difficulty.  She was transitioned to oral pain medication and regular diet.  She had no nausea and was also able to ambulate.  Patient seen both in the evening of POD#0 and was without complaint except she desired discharge hom.  Post op hb was 11.9, decreased from 13.0, pre-operatively.  At this point, patient was voiding, walking, having excellent pain control, had no nausea, and minimal vaginal bleeding.  She was ready for D/C.   Consults: None  Significant Diagnostic Studies: labs: post op hb 11.9  Treatments: surgery: robotic TLH/bilateral salpingectomy, cystoscopy, peritoneal biopsy  Discharge Exam: Blood pressure 112/71, pulse 80, temperature 98.2 F (36.8 C), temperature source Oral, resp. rate 18, last menstrual period 11/09/2013, SpO2 98.00%. General appearance: alert and cooperative Resp: clear to auscultation bilaterally Cardio: regular rate  and rhythm, S1, S2 normal, no murmur, click, rub or gallop GI: soft, non-tender; bowel sounds normal; no masses,  no organomegaly Extremities: extremities normal, atraumatic, no cyanosis or edema Incision/Wound: c/d/i  Disposition: 01-Home or Self Care  Discharge Instructions   Discontinue IV    Complete by:  As directed             Medication List         atorvastatin 40 MG tablet  Commonly known as:  LIPITOR  Take 40 mg by mouth daily.     clonazePAM 0.5 MG tablet  Commonly known as:  KLONOPIN  Take 0.5 mg by mouth daily.     HAIR/SKIN/NAILS PO  Take 1 tablet by mouth daily.     Hydrocodone-Acetaminophen 5-300 MG Tabs  Commonly known as:  VICODIN  Take 1 tablet by mouth every 6 (six) hours. May take two tablets if needed     ibuprofen 800 MG tablet  Commonly known as:  ADVIL,MOTRIN  Take 800 mg by mouth every 8 (eight) hours as needed for moderate pain.     levothyroxine 112 MCG tablet  Commonly known as:  SYNTHROID, LEVOTHROID  Take 112 mcg by mouth daily before breakfast.     mirtazapine 30 MG tablet  Commonly known as:  REMERON  Take 30 mg by mouth at bedtime.     PROAIR HFA IN  Inhale 2 puffs into the lungs as needed (wheezing or shortness of breath).     traMADol 50 MG tablet  Commonly known as:  ULTRAM  Take 2 tablets (100 mg total) by mouth every 6 (six) hours.           Follow-up Information   Follow up with Brittany Amirault, Satira Anis,  MD On 12/26/2013. (appt time 12:30am)    Specialty:  Gynecology   Contact information:   [address] [place] [address] [phone]       Signed: Lyman Speller 12/19/2013, 7:34 PM

## 2013-12-19 NOTE — Addendum Note (Signed)
Addendum created 12/19/13 1730 by Asher Muir, CRNA   Modules edited: Notes Section   Notes Section:  File: [id number]

## 2013-12-19 NOTE — Anesthesia Postprocedure Evaluation (Signed)
  Anesthesia Post-op Note  Anesthesia Post Note  Patient: Pamela Pham  Procedure(s) Performed: Procedure(s) (LRB): ROBOTIC ASSISTED TOTAL HYSTERECTOMY with bilateral salpingectomy . (N/A) CYSTOSCOPY (N/A)  Anesthesia type: General  Patient location: PACU  Post pain: Pain level controlled  Post assessment: Post-op Vital signs reviewed  Last Vitals:  Filed Vitals:   12/19/13 1152  BP: 126/56  Pulse: 69  Temp: 36.4 C  Resp: 20    Post vital signs: Reviewed  Level of consciousness: sedated  Complications: No apparent anesthesia complications

## 2013-12-19 NOTE — Discharge Instructions (Signed)
Post Op Hysterectomy Instructions Please read the instructions below. Refer to these instructions for the next few weeks. These instructions provide you with general information on caring for yourself after surgery. Your caregiver may also give you specific instructions. While your treatment has been planned according to the most current medical practices available, unavoidable problems sometimes happen. If you have any problems or questions after you leave, please call your caregiver.  HOME CARE INSTRUCTIONS Healing will take time. You will have discomfort, tenderness, swelling and bruising at the operative site for a couple of weeks. This is normal and will get better as time goes on.   Only take over-the-counter or prescription medicines for pain, discomfort or fever as directed by your caregiver.   Do not take aspirin. It can cause bleeding.   Do not drive when taking pain medication.   Follow your caregivers advice regarding diet, exercise, lifting, driving and general activities.   Resume your usual diet as directed and allowed.   Get plenty of rest and sleep.   Do not douche, use tampons, or have sexual intercourse until your caregiver gives you permission. .   Take your temperature if you feel hot or flushed.   You may shower today when you get home.  No tub bath for one week.    Do not drink alcohol until you are not taking any narcotic pain medications.   Try to have someone home with you for a week or two to help with the household activities.   Be careful over the next two to three weeks with any activities at home that involve lifting, pushing, or pulling.  Listen to your body--if something feels uncomfortable to do, then don't do it.  Make sure you and your family understands everything about your operation and recovery.   Walking up stairs is fine.  Do not sign any legal documents until you feel normal again.   Keep all your follow-up appointments as recommended by  your caregiver.   PLEASE CALL THE OFFICE IF:  There is swelling, redness or increasing pain in the wound area.   Pus is coming from the wound.   You notice a bad smell from the wound or surgical dressing.   You have pain, redness and swelling from the intravenous site.   The wound is breaking open (the edges are not staying together).    You develop pain or bleeding when you urinate.   You develop abnormal vaginal discharge.   You have any type of abnormal reaction or develop an allergy to your medication.   You need stronger pain medication for your pain   SEEK IMMEDIATE MEDICAL CARE:  You develop a temperature of 100.5 or higher.   You develop abdominal pain.   You develop chest pain.   You develop shortness of breath.   You pass out.   You develop pain, swelling or redness of your leg.   You develop heavy vaginal bleeding with or without blood clots.   MEDICATIONS:  Restart your regular medications BUT wait one week before restarting all vitamins and mineral supplements  Use Tramadol 100mg  every 6 hours.  You can alternate this with Motrin 800mg  every 8 hours and Tylenol 1gram every six hours for the next several days.  Use the narcotic if you really feel you need it.  You may use an over the counter stool softener like Colace or Dulcolax to help with starting a bowel movement.  Start the day after you go home.  Warm liquids, fluids, and ambulation help too.  If you have not had a bowel movement in four days, you need to call the office.

## 2013-12-19 NOTE — H&P (Signed)
Pamela Pham is an 48 y.o. female  G1P1 SWF (with long term significant other) here for robotic assisted TLH, bilateral salpingectomy, possible BSO due to debilitating menorrhagia.  When she is on her cycles, she sleeps on towels in addition to use feminine products due to her heavy flow.  She does have a 3cm polyp found on ultrasound.  She is desirous for definitive treatment so here for hysterectomy.  Pertinent Gynecological History: Menses: heavy and regular Bleeding: menorrhagia Contraception: vasectomy DES exposure: denies Blood transfusions: none Sexually transmitted diseases: no past history Previous GYN Procedures: LEEP in her 62s  Last mammogram: normal Date: 2015 Last pap: normal Date: 4/15 OB History: G1, P1   Menstrual History: Patient's last menstrual period was 11/09/2013.    Past Medical History  Diagnosis Date  . Hypothyroidism   . HOH (hard of hearing)     right ear  . Anxiety   . Depression   . Abnormal Pap smear of cervix   . Migraines   . Asthma   . Dysmenorrhea   . Substance abuse     recovering addict    Past Surgical History  Procedure Laterality Date  . Cholecystectomy      in late 20's  . Cesarean section  2003  . Foreign body removal Right 02/02/2013    Procedure: Exploration right forearm with removal foreign body;  Surgeon: Pamela Amor, MD;  Location: [facility];  Service: Orthopedics;  Laterality: Right;  Foreign body given to patient.  . Cervical biopsy  w/ loop electrode excision      in pts 20's  . Iliac artery stent      age 77    Family History  Problem Relation Age of Onset  . Cancer Maternal Grandmother     leukemia  . Diabetes Maternal Grandmother   . Stroke Maternal Grandmother   . Heart attack Mother   . Cancer Paternal Uncle   . Hypertension Maternal Grandfather   . Thyroid disease Paternal Grandmother     Social History:  reports that she has been smoking Cigarettes.  She has been smoking  about 1.00 pack per day. She does not have any smokeless tobacco history on file. She reports that she does not drink alcohol or use illicit drugs.  Allergies:  Allergies  Allergen Reactions  . Sulfa Antibiotics Nausea And Vomiting    Prescriptions prior to admission  Medication Sig Dispense Refill  . Albuterol Sulfate (PROAIR HFA IN) Inhale into the lungs as needed.      Marland Kitchen levothyroxine (SYNTHROID, LEVOTHROID) 112 MCG tablet Take 112 mcg by mouth daily before breakfast.      . ATORVASTATIN CALCIUM PO Take by mouth daily.      . clonazePAM (KLONOPIN) 0.5 MG tablet daily.      . Hydrocodone-Acetaminophen (VICODIN) 5-300 MG TABS Take 1 tablet by mouth every 6 (six) hours. May take two tablets if needed  15 each  0  . ibuprofen (ADVIL,MOTRIN) 800 MG tablet Take 1 tablet (800 mg total) by mouth every 8 (eight) hours as needed.  30 tablet  0  . mirtazapine (REMERON) 30 MG tablet at bedtime.        Review of Systems  All other systems reviewed and are negative.   Blood pressure 115/49, pulse 65, temperature 97.9 F (36.6 C), resp. rate 20, last menstrual period 11/09/2013, SpO2 98.00%. Physical Exam  Constitutional: She is oriented to person, place, and time. She appears well-developed and well-nourished.  Cardiovascular: Normal rate and regular rhythm.   Respiratory: Effort normal and breath sounds normal.  GI: Soft. Bowel sounds are normal.  Neurological: She is alert and oriented to person, place, and time.  Skin: Skin is warm and dry.  Psychiatric: She has a normal mood and affect.    Results for orders placed during the hospital encounter of 12/19/13 (from the past 24 hour(s))  PREGNANCY, URINE     Status: None   Collection Time    12/19/13  6:15 AM      Result Value Ref Range   Preg Test, Ur NEGATIVE  NEGATIVE    No results found.  Assessment/Plan: 48 yo G1P1 SWF with menorrhagia here for robotic assisted TLH/bilateral salpingectomy/possible BSO, cystoscopy due to  menorrhagia and 3cm polyp.  Pt ready for definitive treatment and ready to go. All questions answered.  Pamela Pham [facility] Pam[facility] Pham - [facility] 12/19/2013, 6:58 AM

## 2013-12-19 NOTE — Op Note (Signed)
12/19/2013  10:10 AM  PATIENT:  Pamela Pham  48 y.o. female  PRE-OPERATIVE DIAGNOSIS:  menorrhagia  POST-OPERATIVE DIAGNOSIS:  menorrhagia,endometriosis,endometrial polyp  PROCEDURE:  Procedure(s): ROBOTIC ASSISTED TOTAL HYSTERECTOMY with bilateral salpingectomy . CYSTOSCOPY  SURGEON:  Zaiyah Sottile SUZANNE  ASSISTANTS: Josefa Half   ANESTHESIA:   general  ESTIMATED BLOOD LOSS: 35cc  BLOOD ADMINISTERED:none   FLUIDS: 1500cc LR  UOP: 250cc clear  SPECIMEN:  Uterus, cervix, bilateral fallopian tubes.  Left sidewall peritoneal biopsy with endometriotic implant  DISPOSITION OF SPECIMEN:  PATHOLOGY  FINDINGS: Adhesions to the lower uterine segment of the bladder from prior cesarean section, bilateral paratubal cysts, filmy adhesions of left colon to left sidewall, normal upper abdomen, normal ovaries, bulky uterus  DESCRIPTION OF OPERATION: Patient is taken to the operating room. She is placed in the supine position. She is a running IV in place. Informed consent was present on the chart. SCDs on her lower extremities and functioning properly. General endotracheal anesthesia was administered by the anesthesia staff without difficulty. Dr.Hatchett oversaw case. Once adequate anesthesia was confirmed the legs are placed in the low lithotomy position in [place]. The patient was already on a beanbag. Her arms were tucked by the side. The beanbag was inflated to ensure that there would be no movement during the Trendelenburg placement.  Dura prep was then used to prep the abdomen and Betadine was used to prep the inner thighs, perineum and vagina. Once 3 minutes had past the patient was draped in a normal standard fashion. The legs were lifted to the high lithotomy position. The cervix was visualized by placing a heavy weighted speculum in the posterior aspect of the vagina and using a curved Deaver retractor to the retract anteriorly. The anterior lip of the cervix was grasped  with single-tooth tenaculum.  A stitch of #0 Vicryl was placed on either side of the cervix.  Needles were cut off suture.  The cervix sounded to 8.5 cm. Pratt dilators were used to dilate the cervix up to a #21. A RUMI uterine manipulator was obtained. A #8 disposable tip was placed on the RUMI manipulator as well as a small KOH ring. This was passed through the cervix and the bulb of the disposable tip was inflated with 10 cc of normal saline. There was a good fit of the KOH ring around the cervix. The sutures were fished through the [place] ring to help with retraction when removing the specimen.  The tenaculum was removed. There is also good manipulation of the uterus. The speculum and retractor were removed as well. A Foley catheter was placed to straight drain. The concentrated urine was noted. Legs were lowered to the low lithotomy position and attention was turned the abdomen.  Ropivacaine mixture (0.5% mixed one-to-one with normal saline) was used anesthetize the skin above the umbilicus.  A Veress needle was obtained.   The abdomen was elevated and the needle was passed directly into the abdomen at the umbilicus. The peritoneum was felt as a pop as it was passed with the needle. A syringe of normal saline was attached the needle and aspiration was performed. No blood or fluid was noted. Fluid was injected without difficulty and a second aspiration was performed. No fluid or blood or saline was noted. Fluid dripped easily into the needle. Low flow CO2 gas was attached the needle and the pneumoperitoneum was achieved without difficulty. Once for 3.0L of gas was in the abdomen the Veress needle was removed and a  12 millimeter non bladed port and trocar were passed directly to the abdomen. The laparoscope was then used to confirm intraperitoneal placement.  The upper abdomen could be surveyed without difficulty. Locations for the 1 and #2 arm ports could also be visualized. The skin was transilluminated and the  skin was anesthetized with ropivacaine mixture. 21mm skin incisions were made about 10 cm lateral to the umbilicus on each side. Then 75mm nondisposable trocar ports were passed directly into the abdomen. Also on the right lower quadrant a 5 mm skin incision was made after anesthetize the skin with the ropivacaine mixture. A 5 mm non-bladed trocar port were also passed directly into the abdomen. All trochars were removed.  The table was placed on the floor and the patient was placed in steep Trendelenburg.  The robot was docked in a normal standard fashion to the left of the table. In the #1 arm was placed endoscopic scissors with monopolar cautery attached and then the #2 arm was placed PK [place] with bipolar cautery attached. The systems are right side of the table for the right lower quadrant incision was located.  Attention was turned to the right side. Ureter was seen.  With uterus on stretch the right tube was excised off the ovary and mesosalpinx was dissected to free the tube. Then the right utero-ovarian pedicle was serially clamped cauterized and incised. Right round ligament was serially clamped cauterized and incised. The anterior and posterior peritoneum of the inferior leaf of the broad ligament were opened. The scar from prior cesarean section was dissected sharply until the bladder flap could be created. The bladder was taken down below the level of the KOH ring. The right uterine artery skeletonized and then just superior to the KOH ring this vessel was serially clamped, cauterized, and incised.  The vessel was very large and tortuous.  Care was taken to ensure excellent hemostasis was present.   There was some endometriosis on the right uterosacral ligament which was cauterized for treatment.    Attention was turned the left side.  There were some filmy adhesions of the colon to the sidewall.  These were excised.  There was and area of endometriosis on the sidewall and a peritoneal biopsy was  taken to remove this endometriosis and to use for specimen.  Then the tube was excised off the ovary using sharp dissection a bipolar cautery.  The mesosalpinx was incised freeing the tube. Then the left uterine ovarian pedicle was serially clamped cauterized and incised. Next the left round ligament was serially clamped cauterized and incised. The anterior posterior peritoneum of the inferiorly for the broad ligament were opened. The anterior peritoneum was carried across to the dissection on the right side. The remainder of the bladder flap was created using sharp dissection. The bladder was well below the level of the KOH ring. The left uterine artery skeletonized. Then the left uterine artery, above the level of the KOH ring, was serially clamped cauterized and incised. The uterus was devascularized at this point.  The colpotomy was performed a starting in the midline and using monopolar cautery with an open edge of the scissors. This was carried around a circumferential fashion until the vaginal mucosa was completely incised in the specimen was freed.  The specimen was then delivered to the vagina.  A vaginal occlusive device was used to maintain the pneumoperitoneum  Instruments were changed with a needle cut suture driver placed in arm 1 and a Cobra grasper placed #2. A 9  inch V. lock suture was passed through the middle port. Starting in the right angle, the cuff was closed encorporating the anterior and posterior vaginal mucosa in each stitch. This was carried across all the way to the left corner and a running fashion. To stitches were brought back towards the midline and the suture was cut flush with the vagina. There was a tiny amt of bleeding along the edge of the posterior vaginal cuff.  This was made hemostatic with monopolar cautery.  The needle was parked on the right sidewall.  The pelvis was irrigated. All pedicles were inspected. No bleeding was noted. In Interceed was placed across vaginal  cuff. Ureters were noted deep in the pelvis to be peristalsing.  At this point the procedure was completed. The instruments were removed. Using an 35mm scope through port #2, the suture and needle were grasped and brought back through the midline port.  At this point the robot was undocked. The patient was taken out of Trendelenburg positioning. The ports were removed under direct vision shove laparoscope and the pneumoperitoneum was relieved. Several deep breaths were given to the patient to try and remove any gas the abdomen.  Finally the midline port was removed.  The midline port was closed at the fascial level with figure-of-eight suture of #0 Vicryl. The skin was then closed with subcuticular stitches of 3-0 Vicryl. The skin was cleansed Dermabond was applied. Attention was then turned the vagina.  Cystoscopy was performed.  The entire bladder was visualized.  No suture of injury was noted.  Urine was seen from each ureter.  Cystoscopy fluid was drained and cystoscope removed.  Foley was replaced.  Then using a speculum, the cuff was inspected. No bleeding was noted. The anterior and posterior vaginal mucosa was well encorporated in each stitch.  Sponge, lap, needle, initially counts were correct x2. Patient tolerated the procedure very well. She was awakened from anesthesia, extubated and taken to recovery in stable condition.   COUNTS:  YES  PLAN OF CARE: Transfer to PACU

## 2013-12-19 NOTE — Progress Notes (Signed)
Discharged home with family members, to car via wheelchair with NT.

## 2013-12-19 NOTE — Progress Notes (Signed)
Discharge instructions reviewed with patient and family.  Patient verbalized understanding of discharge instructions.

## 2013-12-19 NOTE — Transfer of Care (Signed)
Immediate Anesthesia Transfer of Care Note  Patient: Pamela Pham  Procedure(s) Performed: Procedure(s): ROBOTIC ASSISTED TOTAL HYSTERECTOMY with bilateral salpingectomy and possible cysto. (N/A)  Patient Location: PACU  Anesthesia Type:General  Level of Consciousness: awake, alert , oriented and patient cooperative  Airway & Oxygen Therapy: Patient Spontanous Breathing and Patient connected to nasal cannula oxygen  Post-op Assessment: Report given to PACU RN and Post -op Vital signs reviewed and stable  Post vital signs: Reviewed and stable  Complications: No apparent anesthesia complications

## 2013-12-19 NOTE — Progress Notes (Signed)
Day of Surgery Procedure(s) (LRB): ROBOTIC ASSISTED TOTAL HYSTERECTOMY with bilateral salpingectomy . (N/A) CYSTOSCOPY (N/A)  Subjective: Patient reports she wants to go home!  Has tolerated regular diet.  Denies nausea.  Ambulated well.  Voiding without difficulty.  Does not want to take any narcotics.  H/O substance abuse and very concerned about this.  Has used Toradol and Tramadol today with success.    Objective: I have reviewed patient's vital signs, intake and output, medications and labs.  General: alert and cooperative Resp: clear to auscultation bilaterally Cardio: regular rate and rhythm, S1, S2 normal, no murmur, click, rub or gallop GI: soft, non-tender; bowel sounds normal; no masses,  no organomegaly and incision: clean, dry and intact Extremities: extremities normal, atraumatic, no cyanosis or edema Vaginal Bleeding: none  Assessment: s/p Procedure(s): ROBOTIC ASSISTED TOTAL HYSTERECTOMY with bilateral salpingectomy . (N/A) CYSTOSCOPY (N/A): stable and progressing well  Plan: Discontinue IV fluids Discharge home  LOS: 0 days    [name] SUZANNE 12/19/2013, 7:26 PM

## 2013-12-19 NOTE — Anesthesia Postprocedure Evaluation (Signed)
Anesthesia Post Note  Patient: Pamela Pham  Procedure(s) Performed: Procedure(s) (LRB): ROBOTIC ASSISTED TOTAL HYSTERECTOMY with bilateral salpingectomy . (N/A) CYSTOSCOPY (N/A)  Anesthesia type: General  Patient location: [facility]  Post pain: Pain level controlled  Post assessment: Post-op Vital signs reviewed  Last Vitals:  Filed Vitals:   12/19/13 1245  BP: 125/65  Pulse: 66  Temp: 36.3 C  Resp: 20    Post vital signs: Reviewed  Level of consciousness: sedated  Complications: No apparent anesthesia complications

## 2013-12-20 ENCOUNTER — Encounter (HOSPITAL_COMMUNITY): Payer: Self-pay | Admitting: Obstetrics & Gynecology

## 2013-12-26 ENCOUNTER — Ambulatory Visit (INDEPENDENT_AMBULATORY_CARE_PROVIDER_SITE_OTHER): Payer: BC Managed Care – PPO | Admitting: Obstetrics & Gynecology

## 2013-12-26 ENCOUNTER — Encounter: Payer: Self-pay | Admitting: Obstetrics & Gynecology

## 2013-12-26 VITALS — BP 124/80 | HR 60 | Ht 63.0 in | Wt 137.0 lb

## 2013-12-26 DIAGNOSIS — Z9889 Other specified postprocedural states: Secondary | ICD-10-CM

## 2013-12-26 NOTE — Progress Notes (Signed)
Post Operative Visit  Procedure:Robotic Assisted Total Hysterectomy with bilateral salpingectomy Days Post-op: 1 week  Subjective: Really doing quite well.  Taking motrin only.  Never took any post-operative narcotic.  Emptying bladder without difficulty.  Having normal bowel movements.  No nausea.  Energy is low but she expected this.  No fevers.  Minimal VB.    Objective: BP 124/80  Pulse 60  Ht 5\' 3"  (1.6 m)  Wt 137 lb (62.143 kg)  BMI 24.27 kg/m2  LMP 12/02/2013  EXAM General: alert and cooperative Resp: clear to auscultation bilaterally Cardio: regular rate and rhythm, S1, S2 normal, no murmur, click, rub or gallop GI: soft, non-tender; bowel sounds normal; no masses,  no organomegaly and incision: clean, dry and intact Extremities: extremities normal, atraumatic, no cyanosis or edema Vaginal Bleeding: none Gyn:  Cuff well approximated and healing well.  No fullness.  NT.  Assessment: s/p Robotic TLH/bilateral salpingectomy/cystoscopy  Plan: Recheck 3 weeks Limited return to work this week

## 2014-01-12 ENCOUNTER — Telehealth: Payer: Self-pay | Admitting: Obstetrics & Gynecology

## 2014-01-12 MED ORDER — IBUPROFEN 800 MG PO TABS: 800.0000 mg | ORAL_TABLET | Freq: Three times a day (TID) | ORAL | Status: DC | PRN

## 2014-01-12 NOTE — Telephone Encounter (Signed)
Patient notified.  Will pick up rx and call back with any further concerns.

## 2014-01-12 NOTE — Telephone Encounter (Signed)
Patient had a hysterectomy a few week ago. Patient noticed a small amount of blood yesterday.

## 2014-01-12 NOTE — Telephone Encounter (Signed)
Advice given is great.  OK to RF Motrin.  Ok to close encounter.

## 2014-01-12 NOTE — Telephone Encounter (Signed)
Spoke with patient. She is 3 weeks post op. 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 States "it's not even red bleeding." This is has since resolved, patient denies any complaints today. Patient states she feels well and has really been trying to follow Dr. Ammie Ferrier instructions with not lifting and bending but that she may have been more active than she though.  Has not been sexually active. Patient reports back pain, but that is chronic in nature for her. Patient denies dysuria or vaginal discharge. Patient declines office visit with Dr. Sabra Heck, patient prefers to watch symptoms. States she knows how to get in touch with our office if necessary. Patient has follow up with Dr. Sabra Heck on 01/16/14.   Patient requests refill of Motrin 800 mg. Advised can be purchased OTC but patient prefers Rx as she doesn't like having to take so many pills. Advised would send her request to Dr. Sabra Heck.  Patient instructed to continue to not lift or pull greater than 10 lbs.  Patient instructed to call back with any change in symptoms. She is agreeable and verbalized understanding.  Dr. Sabra Heck  Any further instructions? Okay to send motrin refill? Pended order.

## 2014-01-16 ENCOUNTER — Encounter: Payer: Self-pay | Admitting: Obstetrics & Gynecology

## 2014-01-16 ENCOUNTER — Ambulatory Visit (INDEPENDENT_AMBULATORY_CARE_PROVIDER_SITE_OTHER): Payer: BC Managed Care – PPO | Admitting: Obstetrics & Gynecology

## 2014-01-16 VITALS — BP 138/64 | HR 64 | Resp 16 | Ht 63.0 in | Wt 140.0 lb

## 2014-01-16 DIAGNOSIS — Z9889 Other specified postprocedural states: Secondary | ICD-10-CM

## 2014-01-16 NOTE — Progress Notes (Signed)
Post Operative Visit  Procedure: Robotic Assisted Total Hysterectomy with BSO Days Post-op: 22  Subjective: Had a little spotting last week.  This was first spotting she had since surgery.  Bowel and bladder function is normal.  Back at work full time.  Energy is okay.  Reports it continues to improve.    Objective: BP 138/64  Pulse 64  Resp 16  Ht 5\' 3"  (1.6 m)  Wt 140 lb (63.504 kg)  BMI 24.81 kg/m2  LMP 12/02/2013  EXAM General: alert and cooperative Resp: clear to auscultation bilaterally Cardio: regular rate and rhythm, S1, S2 normal, no murmur, click, rub or gallop GI: soft, non-tender; bowel sounds normal; no masses,  no organomegaly and incision: clean, dry, intact and healing well Extremities: extremities normal, atraumatic, no cyanosis or edema Vaginal Bleeding: none Gyn:  NAEFG, vaginal without lesions, cuff well approximated, no masses/firmness  Assessment: s/p TLH/bilateral salpingectomy/cystoscopy  Plan: Reassured pt spotting is normal.  She KNOWS no intercourse for 8 weeks.  As long as spotting stops, pt knows no f/u needed.  She will call if having any bleeding/spotting at week 7 post op. Return 1 year.  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

## 2014-01-17 ENCOUNTER — Ambulatory Visit: Payer: BC Managed Care – PPO | Admitting: Obstetrics & Gynecology

## 2014-02-10 ENCOUNTER — Other Ambulatory Visit: Payer: Self-pay

## 2014-02-27 ENCOUNTER — Encounter: Payer: Self-pay | Admitting: Obstetrics & Gynecology

## 2014-08-14 ENCOUNTER — Ambulatory Visit: Payer: BC Managed Care – PPO | Admitting: Certified Nurse Midwife

## 2015-02-21 ENCOUNTER — Emergency Department (HOSPITAL_BASED_OUTPATIENT_CLINIC_OR_DEPARTMENT_OTHER): Payer: No Typology Code available for payment source

## 2015-02-21 ENCOUNTER — Emergency Department (HOSPITAL_BASED_OUTPATIENT_CLINIC_OR_DEPARTMENT_OTHER)
Admission: EM | Admit: 2015-02-21 | Discharge: 2015-02-21 | Disposition: A | Discharge status: Home / Self Care | Payer: No Typology Code available for payment source | Attending: Emergency Medicine | Admitting: Emergency Medicine

## 2015-02-21 ENCOUNTER — Encounter (HOSPITAL_BASED_OUTPATIENT_CLINIC_OR_DEPARTMENT_OTHER): Payer: Self-pay

## 2015-02-21 DIAGNOSIS — G43909 Migraine, unspecified, not intractable, without status migrainosus: Secondary | ICD-10-CM | POA: Insufficient documentation

## 2015-02-21 DIAGNOSIS — F419 Anxiety disorder, unspecified: Secondary | ICD-10-CM | POA: Insufficient documentation

## 2015-02-21 DIAGNOSIS — F329 Major depressive disorder, single episode, unspecified: Secondary | ICD-10-CM | POA: Insufficient documentation

## 2015-02-21 DIAGNOSIS — Z8742 Personal history of other diseases of the female genital tract: Secondary | ICD-10-CM | POA: Insufficient documentation

## 2015-02-21 DIAGNOSIS — Z791 Long term (current) use of non-steroidal anti-inflammatories (NSAID): Secondary | ICD-10-CM | POA: Insufficient documentation

## 2015-02-21 DIAGNOSIS — H9190 Unspecified hearing loss, unspecified ear: Secondary | ICD-10-CM | POA: Insufficient documentation

## 2015-02-21 DIAGNOSIS — Z720 Tobacco use: Secondary | ICD-10-CM | POA: Insufficient documentation

## 2015-02-21 DIAGNOSIS — J45909 Unspecified asthma, uncomplicated: Secondary | ICD-10-CM | POA: Insufficient documentation

## 2015-02-21 DIAGNOSIS — E039 Hypothyroidism, unspecified: Secondary | ICD-10-CM | POA: Insufficient documentation

## 2015-02-21 DIAGNOSIS — Z79899 Other long term (current) drug therapy: Secondary | ICD-10-CM | POA: Insufficient documentation

## 2015-02-21 DIAGNOSIS — S63501A Unspecified sprain of right wrist, initial encounter: Secondary | ICD-10-CM | POA: Insufficient documentation

## 2015-02-21 DIAGNOSIS — Y9389 Activity, other specified: Secondary | ICD-10-CM | POA: Insufficient documentation

## 2015-02-21 DIAGNOSIS — Y998 Other external cause status: Secondary | ICD-10-CM | POA: Insufficient documentation

## 2015-02-21 DIAGNOSIS — Y92410 Unspecified street and highway as the place of occurrence of the external cause: Secondary | ICD-10-CM | POA: Insufficient documentation

## 2015-02-21 MED ORDER — NAPROXEN 500 MG PO TABS: 500.0000 mg | ORAL_TABLET | Freq: Two times a day (BID) | ORAL | Status: DC
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
## 2015-02-27 ENCOUNTER — Encounter: Payer: Self-pay | Admitting: Obstetrics & Gynecology

## 2015-02-27 ENCOUNTER — Ambulatory Visit (INDEPENDENT_AMBULATORY_CARE_PROVIDER_SITE_OTHER): Payer: 59 | Admitting: Obstetrics & Gynecology

## 2015-02-27 VITALS — BP 130/74 | HR 60 | Resp 18 | Ht 62.0 in | Wt 133.0 lb

## 2015-02-27 DIAGNOSIS — Z124 Encounter for screening for malignant neoplasm of cervix: Secondary | ICD-10-CM

## 2015-02-27 DIAGNOSIS — Z0000 Encounter for general adult medical examination without abnormal findings: Secondary | ICD-10-CM

## 2015-02-27 DIAGNOSIS — Z01419 Encounter for gynecological examination (general) (routine) without abnormal findings: Secondary | ICD-10-CM

## 2015-02-27 MED ORDER — IBUPROFEN 800 MG PO TABS: 800.0000 mg | ORAL_TABLET | Freq: Three times a day (TID) | ORAL | Status: DC | PRN
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
## 2015-02-27 NOTE — Patient Instructions (Signed)
Coconut oil vaginally twice weekly Slippery stuff or astroglide for lubrication

## 2015-02-28 LAB — IPS PAP TEST WITH REFLEX TO HPV

## 2015-08-10 DIAGNOSIS — Z1231 Encounter for screening mammogram for malignant neoplasm of breast: Secondary | ICD-10-CM | POA: Diagnosis not present

## 2015-08-29 ENCOUNTER — Emergency Department (HOSPITAL_COMMUNITY): Payer: BLUE CROSS/BLUE SHIELD

## 2015-08-29 ENCOUNTER — Encounter (HOSPITAL_COMMUNITY): Payer: Self-pay | Admitting: Emergency Medicine

## 2015-08-29 ENCOUNTER — Emergency Department (HOSPITAL_COMMUNITY)
Admission: EM | Admit: 2015-08-29 | Discharge: 2015-08-29 | Disposition: A | Discharge status: Home / Self Care | Payer: BLUE CROSS/BLUE SHIELD | Attending: Emergency Medicine | Admitting: Emergency Medicine

## 2015-08-29 ENCOUNTER — Encounter: Payer: Self-pay | Admitting: Obstetrics & Gynecology

## 2015-08-29 DIAGNOSIS — S161XXA Strain of muscle, fascia and tendon at neck level, initial encounter: Secondary | ICD-10-CM | POA: Diagnosis not present

## 2015-08-29 DIAGNOSIS — R0789 Other chest pain: Secondary | ICD-10-CM | POA: Diagnosis not present

## 2015-08-29 DIAGNOSIS — T148 Other injury of unspecified body region: Secondary | ICD-10-CM | POA: Diagnosis not present

## 2015-08-29 DIAGNOSIS — R079 Chest pain, unspecified: Secondary | ICD-10-CM | POA: Diagnosis not present

## 2015-08-29 DIAGNOSIS — Z79899 Other long term (current) drug therapy: Secondary | ICD-10-CM | POA: Diagnosis not present

## 2015-08-29 DIAGNOSIS — Y998 Other external cause status: Secondary | ICD-10-CM | POA: Diagnosis not present

## 2015-08-29 DIAGNOSIS — Y92410 Unspecified street and highway as the place of occurrence of the external cause: Secondary | ICD-10-CM | POA: Diagnosis not present

## 2015-08-29 DIAGNOSIS — S59912A Unspecified injury of left forearm, initial encounter: Secondary | ICD-10-CM | POA: Diagnosis not present

## 2015-08-29 DIAGNOSIS — Z23 Encounter for immunization: Secondary | ICD-10-CM | POA: Diagnosis not present

## 2015-08-29 DIAGNOSIS — F17210 Nicotine dependence, cigarettes, uncomplicated: Secondary | ICD-10-CM | POA: Insufficient documentation

## 2015-08-29 DIAGNOSIS — T1490XA Injury, unspecified, initial encounter: Secondary | ICD-10-CM

## 2015-08-29 DIAGNOSIS — S29001A Unspecified injury of muscle and tendon of front wall of thorax, initial encounter: Secondary | ICD-10-CM | POA: Diagnosis not present

## 2015-08-29 DIAGNOSIS — S0990XA Unspecified injury of head, initial encounter: Secondary | ICD-10-CM | POA: Diagnosis not present

## 2015-08-29 DIAGNOSIS — Y9389 Activity, other specified: Secondary | ICD-10-CM | POA: Diagnosis not present

## 2015-08-29 DIAGNOSIS — S299XXA Unspecified injury of thorax, initial encounter: Secondary | ICD-10-CM | POA: Diagnosis not present

## 2015-08-29 DIAGNOSIS — M79632 Pain in left forearm: Secondary | ICD-10-CM | POA: Diagnosis not present

## 2015-08-29 DIAGNOSIS — R10819 Abdominal tenderness, unspecified site: Secondary | ICD-10-CM | POA: Diagnosis not present

## 2015-08-29 DIAGNOSIS — S199XXA Unspecified injury of neck, initial encounter: Secondary | ICD-10-CM | POA: Diagnosis not present

## 2015-08-29 DIAGNOSIS — R51 Headache: Secondary | ICD-10-CM | POA: Diagnosis not present

## 2015-08-29 DIAGNOSIS — M542 Cervicalgia: Secondary | ICD-10-CM | POA: Diagnosis not present

## 2015-08-29 DIAGNOSIS — S6992XA Unspecified injury of left wrist, hand and finger(s), initial encounter: Secondary | ICD-10-CM | POA: Diagnosis not present

## 2015-08-29 MED ORDER — TETANUS-DIPHTH-ACELL PERTUSSIS 5-2.5-18.5 LF-MCG/0.5 IM SUSP
0.5000 mL | Freq: Once | INTRAMUSCULAR | Status: AC
  Administered 2015-08-29: 0.5 mL via INTRAMUSCULAR
  Filled 2015-08-29: qty 0.5

## 2015-08-29 NOTE — ED Notes (Signed)
Declined W/C at D/C and was escorted to lobby by RN. 
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
## 2015-09-19 DIAGNOSIS — E039 Hypothyroidism, unspecified: Secondary | ICD-10-CM | POA: Diagnosis not present

## 2015-09-26 DIAGNOSIS — F329 Major depressive disorder, single episode, unspecified: Secondary | ICD-10-CM | POA: Diagnosis not present

## 2015-09-26 DIAGNOSIS — F411 Generalized anxiety disorder: Secondary | ICD-10-CM | POA: Diagnosis not present

## 2015-09-26 DIAGNOSIS — E039 Hypothyroidism, unspecified: Secondary | ICD-10-CM | POA: Diagnosis not present

## 2015-09-26 DIAGNOSIS — J302 Other seasonal allergic rhinitis: Secondary | ICD-10-CM | POA: Diagnosis not present

## 2015-09-26 DIAGNOSIS — F17210 Nicotine dependence, cigarettes, uncomplicated: Secondary | ICD-10-CM | POA: Diagnosis not present

## 2015-11-05 DIAGNOSIS — L02234 Carbuncle of groin: Secondary | ICD-10-CM | POA: Diagnosis not present

## 2015-12-27 DIAGNOSIS — E039 Hypothyroidism, unspecified: Secondary | ICD-10-CM | POA: Diagnosis not present

## 2016-04-24 NOTE — Progress Notes (Signed)
50 y.o. PO:[id number] DivorcedCaucasianF here for annual exam.  Doing well.  Still at [facility].  Worked for a few months in [place] with a recovery program but this was not a good fit for her.  Having some thyroid issues.  Seeing Dr. Chalmers Cater in the next month.  Had additional lab work with PCP in June.  Patient's last menstrual period was 12/02/2013.          Sexually active: Yes.    The current method of family planning is status post hysterectomy.    Exercising: Yes.    walking Smoker:  yes  Health Maintenance: Pap:  02/27/15 negative  History of abnormal Pap:  yes MMG:  08/08/14 BIRADS 1 negative, pt states she had 05/17 at [facility] Colonoscopy:  never BMD:   never TDaP:  08/29/15  Pneumonia vaccine(s):  never Zostavax:   never Hep C testing: not indicated Screening Labs: PCP and Dr. Chalmers Cater, Hb today: same, Urine today: PCP   reports that she has been smoking Cigarettes.  She has been smoking about 0.50 packs per day. She has never used smokeless tobacco. She reports that she does not drink alcohol or use drugs.  Past Medical History:  Diagnosis Date  . Abnormal Pap smear of cervix   . Anxiety   . Asthma   . Depression   . Dysmenorrhea   . HOH (hard of hearing)    right ear  . Hypothyroidism   . Migraines   . Substance abuse    recovering addict    Past Surgical History:  Procedure Laterality Date  . ABDOMINAL HYSTERECTOMY    . CERVICAL BIOPSY  W/ LOOP ELECTRODE EXCISION     in pts 20's  . CESAREAN SECTION  2003  . CHOLECYSTECTOMY     in late 20's  . CYSTOSCOPY N/A 12/19/2013   Procedure: CYSTOSCOPY;  Surgeon: Lyman Speller, MD;  Location: [facility];  Service: Gynecology;  Laterality: N/A;  . FOREIGN BODY REMOVAL Right 02/02/2013   Procedure: Exploration right forearm with removal foreign body;  Surgeon: Schuyler Amor, MD;  Location: [place];  Service: Orthopedics;  Laterality: Right;  Foreign body given to patient.  Marland Kitchen ILIAC ARTERY STENT      age 11, congenital stricture of vessel  . ROBOTIC ASSISTED TOTAL HYSTERECTOMY N/A 12/19/2013   Procedure: ROBOTIC ASSISTED TOTAL HYSTERECTOMY with bilateral salpingectomy .;  Surgeon: Lyman Speller, MD;  Location: [facility];  Service: Gynecology;  Laterality: N/A;    Current Outpatient Prescriptions  Medication Sig Dispense Refill  . Albuterol Sulfate (PROAIR HFA IN) Inhale 2 puffs into the lungs as needed (wheezing or shortness of breath).     Marland Kitchen atorvastatin (LIPITOR) 40 MG tablet Take 40 mg by mouth daily.    . clonazePAM (KLONOPIN) 0.5 MG tablet Take 0.5 mg by mouth daily.     Marland Kitchen ibuprofen (ADVIL,MOTRIN) 800 MG tablet Take 1 tablet (800 mg total) by mouth every 8 (eight) hours as needed. 30 tablet 1  . levothyroxine (SYNTHROID, LEVOTHROID) 100 MCG tablet 2 (two) times a week.     . levothyroxine (SYNTHROID, LEVOTHROID) 112 MCG tablet Take 112 mcg by mouth daily before breakfast.    . mirtazapine (REMERON) 45 MG tablet   0  . Multiple Vitamins-Minerals (HAIR/SKIN/NAILS PO) Take 1 tablet by mouth daily.     No current facility-administered medications for this visit.     Family History  Problem Relation Age of Onset  . Cancer Maternal Grandmother  leukemia  . Diabetes Maternal Grandmother   . Stroke Maternal Grandmother   . Heart attack Mother   . Cancer Paternal Uncle   . Hypertension Maternal Grandfather   . Thyroid disease Paternal Grandmother     ROS:  Pertinent items are noted in HPI.  Otherwise, a comprehensive ROS was negative.  Exam:   BP 120/60 (BP Location: Right Arm, Patient Position: Sitting, Cuff Size: Normal)   Pulse 70   Resp 12   Ht 5\' 2"  (1.575 m)   Wt 138 lb 3.2 oz (62.7 kg)   LMP 12/02/2013   BMI 25.28 kg/m   Weight change: +5#  Height: 5\' 2"  (157.5 cm)  Ht Readings from Last 3 Encounters:  04/25/16 5\' 2"  (1.575 m)  02/27/15 5\' 2"  (1.575 m)  02/21/15 5\' 2"  (1.575 m)    General appearance: alert, cooperative and appears stated age Head:  Normocephalic, without obvious abnormality, atraumatic Neck: no adenopathy, supple, symmetrical, trachea midline and thyroid normal to inspection and palpation Lungs: clear to auscultation bilaterally Breasts: normal appearance, no masses or tenderness Heart: regular rate and rhythm Abdomen: soft, non-tender; bowel sounds normal; no masses,  no organomegaly Extremities: extremities normal, atraumatic, no cyanosis or edema Skin: Skin color, texture, turgor normal. No rashes or lesions Lymph nodes: Cervical, supraclavicular, and axillary nodes normal. No abnormal inguinal nodes palpated Neurologic: Grossly normal   Pelvic: External genitalia:  no lesions              Urethra:  normal appearing urethra with no masses, tenderness or lesions              Bartholins and Skenes: normal                 Vagina: normal appearing vagina with normal color and discharge, no lesions              Cervix: absent              Pap taken: Yes.   Bimanual Exam:  Uterus:  uterus absent              Adnexa: normal adnexa               Rectovaginal: Confirms               Anus:  normal sphincter tone, no lesions  Chaperone was present for exam.  A:    Well Woman with normal exam H/O substance abuse H/O robotic TLH/bilateral salpingectomy (pathology with CIN 2, endometrial polyp, endometriosis)  H/O iliac stent placement age 47 due to clot Smoker (1/2 PPD) H/O abnormal pap with LEEP and h/o 5FU use, then with CIN 2 on pathology with hysterectomy Hypothyroidism Asthma Bronchitis  P:         Mammogram yealry pap smear today.  Pt aware needs yearly paps due to pathology on pap. Declines colonoscopy.  States will do it next year. Labs and vaccines with PCP and Dr. Chalmers Cater Ibuprofen 800mg  tid prn #30/2RF to pharmacy Augmentin 875mg  bid x 7 days Return annually or prn

## 2016-04-25 ENCOUNTER — Encounter: Payer: Self-pay | Admitting: Obstetrics & Gynecology

## 2016-04-25 ENCOUNTER — Ambulatory Visit (INDEPENDENT_AMBULATORY_CARE_PROVIDER_SITE_OTHER): Payer: BLUE CROSS/BLUE SHIELD | Admitting: Obstetrics & Gynecology

## 2016-04-25 VITALS — BP 120/60 | HR 70 | Resp 12 | Ht 62.0 in | Wt 138.2 lb

## 2016-04-25 DIAGNOSIS — Z124 Encounter for screening for malignant neoplasm of cervix: Secondary | ICD-10-CM | POA: Diagnosis not present

## 2016-04-25 DIAGNOSIS — Z01419 Encounter for gynecological examination (general) (routine) without abnormal findings: Secondary | ICD-10-CM

## 2016-04-25 MED ORDER — IBUPROFEN 800 MG PO TABS: 800.0000 mg | ORAL_TABLET | Freq: Three times a day (TID) | ORAL | 2 refills | Status: DC | PRN

## 2016-04-25 MED ORDER — AMOXICILLIN-POT CLAVULANATE 875-125 MG PO TABS: 1.0000 | ORAL_TABLET | Freq: Two times a day (BID) | ORAL | 0 refills | Status: DC

## 2016-05-01 LAB — IPS PAP TEST WITH REFLEX TO HPV

## 2016-06-20 ENCOUNTER — Ambulatory Visit: Payer: 59 | Admitting: Obstetrics & Gynecology

## 2016-08-11 DIAGNOSIS — Z1231 Encounter for screening mammogram for malignant neoplasm of breast: Secondary | ICD-10-CM | POA: Diagnosis not present

## 2016-08-18 DIAGNOSIS — F411 Generalized anxiety disorder: Secondary | ICD-10-CM | POA: Diagnosis not present

## 2016-08-18 DIAGNOSIS — E039 Hypothyroidism, unspecified: Secondary | ICD-10-CM | POA: Diagnosis not present

## 2016-08-18 DIAGNOSIS — Z1389 Encounter for screening for other disorder: Secondary | ICD-10-CM | POA: Diagnosis not present

## 2016-08-18 DIAGNOSIS — F329 Major depressive disorder, single episode, unspecified: Secondary | ICD-10-CM | POA: Diagnosis not present

## 2016-08-18 DIAGNOSIS — E782 Mixed hyperlipidemia: Secondary | ICD-10-CM | POA: Diagnosis not present

## 2016-09-29 DIAGNOSIS — E039 Hypothyroidism, unspecified: Secondary | ICD-10-CM | POA: Diagnosis not present

## 2016-10-08 DIAGNOSIS — Z6823 Body mass index (BMI) 23.0-23.9, adult: Secondary | ICD-10-CM | POA: Diagnosis not present

## 2016-10-08 DIAGNOSIS — R05 Cough: Secondary | ICD-10-CM | POA: Diagnosis not present
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
## 2017-02-05 ENCOUNTER — Other Ambulatory Visit: Payer: Self-pay | Admitting: Obstetrics & Gynecology

## 2017-02-05 ENCOUNTER — Telehealth: Payer: Self-pay | Admitting: Obstetrics & Gynecology

## 2017-02-05 MED ORDER — IBUPROFEN 800 MG PO TABS: 800.0000 mg | ORAL_TABLET | Freq: Three times a day (TID) | ORAL | 0 refills | Status: DC | PRN

## 2017-02-05 NOTE — Telephone Encounter (Signed)
Refill done.  Please let her know.

## 2017-02-05 NOTE — Telephone Encounter (Signed)
Spoke with patient, requesting refill of Motrin 800 mg for headache.   Last filled 04/25/16 #30/2RF Last AEX 04/25/16 Next AEX: not scheduled  Advised patient would review with Dr. Sabra Heck and return call with recommendations. Verified pharmacy on file.  Dr. Sabra Heck -please advise on refill?

## 2017-02-05 NOTE — Telephone Encounter (Signed)
Patient called states she has a really bad headache and has been having them for a couple of weeks.  Wants to know if Dr Sabra Heck will call in a script for Ibuprofen 800mg .

## 2017-02-06 NOTE — Telephone Encounter (Signed)
Left detailed message, ok per current dpr. Advised requested refill sent to [place], f/u for filling. May return call to office for any additional questions. Will close encounter.

## 2017-03-02 DIAGNOSIS — E782 Mixed hyperlipidemia: Secondary | ICD-10-CM | POA: Diagnosis not present

## 2017-03-02 DIAGNOSIS — Z0001 Encounter for general adult medical examination with abnormal findings: Secondary | ICD-10-CM | POA: Diagnosis not present

## 2017-03-02 DIAGNOSIS — Z6822 Body mass index (BMI) 22.0-22.9, adult: Secondary | ICD-10-CM | POA: Diagnosis not present

## 2017-03-02 DIAGNOSIS — E039 Hypothyroidism, unspecified: Secondary | ICD-10-CM | POA: Diagnosis not present

## 2017-03-02 DIAGNOSIS — Z23 Encounter for immunization: Secondary | ICD-10-CM | POA: Diagnosis not present

## 2017-05-11 DIAGNOSIS — L600 Ingrowing nail: Secondary | ICD-10-CM | POA: Diagnosis not present

## 2017-05-11 DIAGNOSIS — M25774 Osteophyte, right foot: Secondary | ICD-10-CM | POA: Diagnosis not present

## 2017-05-20 ENCOUNTER — Telehealth: Payer: Self-pay | Admitting: Obstetrics & Gynecology

## 2017-05-20 NOTE — Telephone Encounter (Signed)
Patient is having hot flashes and night sweats. Last visit 04/25/16.

## 2017-05-21 NOTE — Telephone Encounter (Signed)
Spoke with patient. Patient states that over the last 6-7 months she has been having night sweats and hot flashes that are impacting her daily function and sleep. Over the last 3 months symptoms have worsened. States she is waking up 3-4 times a night. Advised she will need to be seen for further evaluation with Dr.Miller. Appointment scheduled for 05/28/2017 at 3:30 pm with Dr.Miller. Patient declines all earlier appointments offered.  Routing to provider for final review. Patient agreeable to disposition. Will close encounter.

## 2017-05-28 ENCOUNTER — Encounter: Payer: Self-pay | Admitting: Obstetrics & Gynecology

## 2017-05-28 ENCOUNTER — Ambulatory Visit: Payer: BLUE CROSS/BLUE SHIELD | Admitting: Obstetrics & Gynecology

## 2017-05-28 VITALS — BP 100/56 | HR 68 | Resp 16 | Wt 128.0 lb

## 2017-05-28 DIAGNOSIS — N951 Menopausal and female climacteric states: Secondary | ICD-10-CM
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
## 2017-05-29 ENCOUNTER — Telehealth: Payer: Self-pay | Admitting: Obstetrics & Gynecology

## 2017-05-29 MED ORDER — GABAPENTIN 100 MG PO CAPS: ORAL_CAPSULE | ORAL | 0 refills | Status: DC

## 2017-05-29 NOTE — Telephone Encounter (Signed)
Patient called and said the [facility] in [place] does not have her prescription for Gabapentin that Dr. Sabra Heck was prescribed for her yesterday at her visit.  [facility] in [place]

## 2017-05-29 NOTE — Telephone Encounter (Signed)
Patient was seen on 05/28/2017. See plan below. Rx for Gabapentin 100 mg nightly for 7 nights and increase to 200 mg for 7 nights and then to 300 mg for 7 nights #42 0RF sent to pharmacy on file. Patient to call with an update in 3 weeks.  Plan: Hormone replacement therapy, SSRI use, gabapentin all reviewed.  She does have concerns about breast cancer risks and would like to try nonhormonal therapy first.  She will start with gabapentin 100 mg nightly for 7 days and increase to 200 mg for 7 nights and then to 300 mg for 7 nights.  Side effects reviewed especially fatigue, dizziness, sleepiness.  She is going to give me an update in 3 weeks.  If we need to proceed with HRT we can do that and would use estrogen alone.  Routing to provider for final review. Patient agreeable to disposition. Will close encounter.

## 2017-06-04 DIAGNOSIS — Z6822 Body mass index (BMI) 22.0-22.9, adult: Secondary | ICD-10-CM | POA: Diagnosis not present

## 2017-06-04 DIAGNOSIS — J09X2 Influenza due to identified novel influenza A virus with other respiratory manifestations: Secondary | ICD-10-CM | POA: Diagnosis not present

## 2017-06-19 ENCOUNTER — Telehealth: Payer: Self-pay

## 2017-06-19 NOTE — Telephone Encounter (Signed)
Received refill request for Gabapentin 100mg  #42, [facility].  Called patient stating we needed an update since starting Gabapentin. She states it has helped very little with hot flashes. She is still waking up at night with sweats and "flashing". Gabapentin has not helped like she hoped it would. Advised would let Dr.Miller know.  Medication refill request: Gabapentin 100mg  Last AEX:  04-25-16 Next AEX: none scheduled Last MMG (if hormonal medication request): 08-11-16 [id number] Refill authorized: Please advise

## 2017-06-19 NOTE — Telephone Encounter (Signed)
I just want to make sure she has worked up to the 300mg  dosage.  I she has, she can consider an SSRI like paxil 10mg  daily to see if this will help.  Or she could consider hormonal therapy but I think she's really trying not to take hormones.  Thanks.

## 2017-06-24 MED ORDER — PAROXETINE HCL 10 MG PO TABS: 10.0000 mg | ORAL_TABLET | Freq: Every day | ORAL | 0 refills | Status: DC

## 2017-06-24 MED ORDER — GABAPENTIN 300 MG PO CAPS: 300.0000 mg | ORAL_CAPSULE | Freq: Every day | ORAL | 0 refills | Status: DC

## 2017-06-24 NOTE — Telephone Encounter (Signed)
Called and notified patient message below from [place]. Will send in Rx for Paxil 10mg  #30, start with 1/2 tablet daily for four days and then increase to 1 tablet daily, and Rx for Gabapentin 300mg  #30 to take daily. Patient will call with an update in 2 to 3 weeks. Routed to [place]

## 2017-06-24 NOTE — Telephone Encounter (Signed)
OK to start paxil 10mg  daily.  Should start with 1/2 tab daily for four days and then increase to full tablet.  Ok to continue the gabapentin as well.  If paxil really helps, then can decide if should stop the gabapentin.  She is trying not to be on HRT.

## 2017-06-24 NOTE — Telephone Encounter (Signed)
Spoke with patient and she states has been on Gabapentin 300mg  for only 7 days without much relief in symptoms. She states maybe she hasn't given it long enough. She is willing to give more time or switch to SSRI, whatever Dr.Miller suggests. Will discuss with Dr.Miller and call her back. Routed to [place]

## 2017-07-22 ENCOUNTER — Other Ambulatory Visit: Payer: Self-pay | Admitting: Obstetrics & Gynecology

## 2017-07-22 NOTE — Telephone Encounter (Signed)
Medication refill request: Neurontin  Last AEX:  04-25-16  Last OV: 05-28-17  Next AEX: not scheduled  Last MMG (if hormonal medication request): 08-11-16 WNL  Refill authorized: please advise

## 2017-07-25 NOTE — Telephone Encounter (Signed)
Please check with pt about how this has worked for her hot flashes and what dosage is she taking.  Thanks.

## 2017-07-27 NOTE — Telephone Encounter (Signed)
Spoke with patient and she states that it is helping with her hot flashes. She is taking 300mg  daily of the Gabapentin.

## 2017-08-13 ENCOUNTER — Other Ambulatory Visit: Payer: Self-pay | Admitting: Obstetrics & Gynecology

## 2017-08-13 NOTE — Telephone Encounter (Signed)
Patient called and stated that she had a missed call from [facility]. No phone note. She assumed it was in regards to her prescription refill request.

## 2017-08-13 NOTE — Telephone Encounter (Signed)
Called patient and left message for her to return my call(we need an update on Prozac before refills).

## 2017-08-13 NOTE — Telephone Encounter (Signed)
Spoke with patient and she states Prozac and Gabapentin together are helping with hot flashes. They are not totally resolved but it has helped and requesting refill.  Medication refill request: Prozac 10mg  #30 Last AEX: 04-25-16, last ov 05-28-17 Next AEX: none scheduled Last MMG (if hormonal medication request): 08-11-16 [id number] Refill authorized: please advise

## 2017-08-25 DIAGNOSIS — Z1231 Encounter for screening mammogram for malignant neoplasm of breast: Secondary | ICD-10-CM | POA: Diagnosis not present

## 2017-08-27 DIAGNOSIS — Z1331 Encounter for screening for depression: Secondary | ICD-10-CM | POA: Diagnosis not present

## 2017-08-27 DIAGNOSIS — F411 Generalized anxiety disorder: Secondary | ICD-10-CM | POA: Diagnosis not present

## 2017-08-27 DIAGNOSIS — E782 Mixed hyperlipidemia: Secondary | ICD-10-CM | POA: Diagnosis not present

## 2017-08-27 DIAGNOSIS — J302 Other seasonal allergic rhinitis: Secondary | ICD-10-CM | POA: Diagnosis not present

## 2017-08-27 DIAGNOSIS — E039 Hypothyroidism, unspecified: Secondary | ICD-10-CM | POA: Diagnosis not present

## 2017-08-27 DIAGNOSIS — Z1389 Encounter for screening for other disorder: Secondary | ICD-10-CM | POA: Diagnosis not present

## 2017-08-27 DIAGNOSIS — F17210 Nicotine dependence, cigarettes, uncomplicated: Secondary | ICD-10-CM | POA: Diagnosis not present

## 2017-08-27 DIAGNOSIS — F329 Major depressive disorder, single episode, unspecified: Secondary | ICD-10-CM | POA: Diagnosis not present

## 2017-09-06 ENCOUNTER — Emergency Department (HOSPITAL_COMMUNITY): Payer: BLUE CROSS/BLUE SHIELD

## 2017-09-06 ENCOUNTER — Encounter (HOSPITAL_COMMUNITY): Payer: Self-pay | Admitting: Emergency Medicine

## 2017-09-06 ENCOUNTER — Other Ambulatory Visit: Payer: Self-pay

## 2017-09-06 ENCOUNTER — Inpatient Hospital Stay (HOSPITAL_COMMUNITY)
Admission: EM | Admit: 2017-09-06 | Discharge: 2017-09-08 | DRG: 392 | Disposition: A | Discharge status: Home / Self Care | Payer: BLUE CROSS/BLUE SHIELD | Attending: Internal Medicine | Admitting: Internal Medicine

## 2017-09-06 DIAGNOSIS — Z716 Tobacco abuse counseling: Secondary | ICD-10-CM

## 2017-09-06 DIAGNOSIS — J45909 Unspecified asthma, uncomplicated: Secondary | ICD-10-CM | POA: Diagnosis present

## 2017-09-06 DIAGNOSIS — G43909 Migraine, unspecified, not intractable, without status migrainosus: Secondary | ICD-10-CM | POA: Diagnosis present

## 2017-09-06 DIAGNOSIS — Z885 Allergy status to narcotic agent status: Secondary | ICD-10-CM | POA: Diagnosis not present

## 2017-09-06 DIAGNOSIS — Z79890 Hormone replacement therapy (postmenopausal): Secondary | ICD-10-CM

## 2017-09-06 DIAGNOSIS — Z79899 Other long term (current) drug therapy: Secondary | ICD-10-CM

## 2017-09-06 DIAGNOSIS — Z882 Allergy status to sulfonamides status: Secondary | ICD-10-CM

## 2017-09-06 DIAGNOSIS — F418 Other specified anxiety disorders: Secondary | ICD-10-CM | POA: Diagnosis present

## 2017-09-06 DIAGNOSIS — Z9049 Acquired absence of other specified parts of digestive tract: Secondary | ICD-10-CM | POA: Diagnosis not present

## 2017-09-06 DIAGNOSIS — Z823 Family history of stroke: Secondary | ICD-10-CM | POA: Diagnosis not present

## 2017-09-06 DIAGNOSIS — Z8249 Family history of ischemic heart disease and other diseases of the circulatory system: Secondary | ICD-10-CM | POA: Diagnosis not present

## 2017-09-06 DIAGNOSIS — R3911 Hesitancy of micturition: Secondary | ICD-10-CM | POA: Diagnosis not present

## 2017-09-06 DIAGNOSIS — E039 Hypothyroidism, unspecified: Secondary | ICD-10-CM | POA: Diagnosis not present

## 2017-09-06 DIAGNOSIS — R1031 Right lower quadrant pain: Secondary | ICD-10-CM | POA: Diagnosis present

## 2017-09-06 DIAGNOSIS — Z90710 Acquired absence of both cervix and uterus: Secondary | ICD-10-CM | POA: Diagnosis not present

## 2017-09-06 DIAGNOSIS — E038 Other specified hypothyroidism: Secondary | ICD-10-CM | POA: Diagnosis not present

## 2017-09-06 DIAGNOSIS — E785 Hyperlipidemia, unspecified: Secondary | ICD-10-CM

## 2017-09-06 DIAGNOSIS — Z8349 Family history of other endocrine, nutritional and metabolic diseases: Secondary | ICD-10-CM | POA: Diagnosis not present

## 2017-09-06 DIAGNOSIS — E782 Mixed hyperlipidemia: Secondary | ICD-10-CM

## 2017-09-06 DIAGNOSIS — Z806 Family history of leukemia: Secondary | ICD-10-CM | POA: Diagnosis not present

## 2017-09-06 DIAGNOSIS — Z720 Tobacco use: Secondary | ICD-10-CM | POA: Diagnosis not present

## 2017-09-06 DIAGNOSIS — K5720 Diverticulitis of large intestine with perforation and abscess without bleeding: Principal | ICD-10-CM | POA: Diagnosis present

## 2017-09-06 DIAGNOSIS — K5792 Diverticulitis of intestine, part unspecified, without perforation or abscess without bleeding: Secondary | ICD-10-CM | POA: Diagnosis present

## 2017-09-06 DIAGNOSIS — H9191 Unspecified hearing loss, right ear: Secondary | ICD-10-CM | POA: Diagnosis present

## 2017-09-06 DIAGNOSIS — Z833 Family history of diabetes mellitus: Secondary | ICD-10-CM

## 2017-09-06 DIAGNOSIS — R109 Unspecified abdominal pain: Secondary | ICD-10-CM | POA: Diagnosis not present

## 2017-09-06 DIAGNOSIS — F17210 Nicotine dependence, cigarettes, uncomplicated: Secondary | ICD-10-CM | POA: Diagnosis not present

## 2017-09-06 LAB — CBC WITH DIFFERENTIAL/PLATELET
Basophils Absolute: 0 10*3/uL (ref 0.0–0.1)
Basophils Relative: 0 %
EOS ABS: 0.1 10*3/uL (ref 0.0–0.7)
EOS PCT: 1 %
HCT: 40.9 % (ref 36.0–46.0)
Hemoglobin: 13.4 g/dL (ref 12.0–15.0)
Lymphocytes Relative: 12 %
Lymphs Abs: 1.4 10*3/uL (ref 0.7–4.0)
MCH: 30.9 pg (ref 26.0–34.0)
MCHC: 32.8 g/dL (ref 30.0–36.0)
MCV: 94.5 fL (ref 78.0–100.0)
Monocytes Absolute: 0.6 10*3/uL (ref 0.1–1.0)
Monocytes Relative: 5 %
Neutro Abs: 9.7 10*3/uL — ABNORMAL HIGH (ref 1.7–7.7)
Neutrophils Relative %: 82 %
PLATELETS: 333 10*3/uL (ref 150–400)
RBC: 4.33 MIL/uL (ref 3.87–5.11)
RDW: 13.8 % (ref 11.5–15.5)
WBC: 11.8 10*3/uL — AB (ref 4.0–10.5)

## 2017-09-06 LAB — URINALYSIS, ROUTINE W REFLEX MICROSCOPIC
Bacteria, UA: NONE SEEN
Bilirubin Urine: NEGATIVE
GLUCOSE, UA: NEGATIVE mg/dL
Ketones, ur: NEGATIVE mg/dL
Leukocytes, UA: NEGATIVE
Nitrite: NEGATIVE
PROTEIN: NEGATIVE mg/dL
Specific Gravity, Urine: 1.009 (ref 1.005–1.030)
pH: 5 (ref 5.0–8.0)

## 2017-09-06 LAB — COMPREHENSIVE METABOLIC PANEL
ALBUMIN: 3.5 g/dL (ref 3.5–5.0)
ALT: 15 U/L (ref 14–54)
AST: 13 U/L — AB (ref 15–41)
Alkaline Phosphatase: 87 U/L (ref 38–126)
Anion gap: 9 (ref 5–15)
BUN: 8 mg/dL (ref 6–20)
CHLORIDE: 105 mmol/L (ref 101–111)
CO2: 27 mmol/L (ref 22–32)
Calcium: 9 mg/dL (ref 8.9–10.3)
Creatinine, Ser: 0.77 mg/dL (ref 0.44–1.00)
GFR calc Af Amer: >60 mL/min (ref >60)
GFR calc non Af Amer: >60 mL/min (ref >60)
Glucose, Bld: 119 mg/dL — ABNORMAL HIGH (ref 65–99)
Potassium: 3.7 mmol/L (ref 3.5–5.1)
Sodium: 141 mmol/L (ref 135–145)
Total Bilirubin: 0.6 mg/dL (ref 0.3–1.2)
Total Protein: 7.2 g/dL (ref 6.5–8.1)

## 2017-09-06 MED ORDER — METRONIDAZOLE IN NACL 5-0.79 MG/ML-% IV SOLN
500.0000 mg | Freq: Once | INTRAVENOUS | Status: AC
  Administered 2017-09-06: 500 mg via INTRAVENOUS
  Filled 2017-09-06: qty 100

## 2017-09-06 MED ORDER — ACETAMINOPHEN 325 MG PO TABS
650.0000 mg | ORAL_TABLET | Freq: Four times a day (QID) | ORAL | Status: DC | PRN
  Administered 2017-09-06: 650 mg via ORAL
  Filled 2017-09-06: qty 2

## 2017-09-06 MED ORDER — HYDROMORPHONE HCL 1 MG/ML IJ SOLN
0.5000 mg | Freq: Once | INTRAMUSCULAR | Status: AC
  Administered 2017-09-06: 0.5 mg via INTRAVENOUS
  Filled 2017-09-06: qty 1

## 2017-09-06 MED ORDER — SODIUM CHLORIDE 0.9 % IV SOLN
1.0000 g | Freq: Once | INTRAVENOUS | Status: AC
  Administered 2017-09-06: 1 g via INTRAVENOUS
  Filled 2017-09-06: qty 10

## 2017-09-06 MED ORDER — ENOXAPARIN SODIUM 40 MG/0.4ML ~~LOC~~ SOLN
40.0000 mg | SUBCUTANEOUS | Status: DC
  Administered 2017-09-06 – 2017-09-07 (×2): 40 mg via SUBCUTANEOUS
  Filled 2017-09-06 (×2): qty 0.4

## 2017-09-06 MED ORDER — ACETAMINOPHEN 650 MG RE SUPP: 650.0000 mg | Freq: Four times a day (QID) | RECTAL | Status: DC | PRN

## 2017-09-06 MED ORDER — LEVOTHYROXINE SODIUM 112 MCG PO TABS
112.0000 ug | ORAL_TABLET | Freq: Every day | ORAL | Status: DC
  Administered 2017-09-07 – 2017-09-08 (×2): 112 ug via ORAL
  Filled 2017-09-06 (×2): qty 1

## 2017-09-06 MED ORDER — ONDANSETRON HCL 4 MG/2ML IJ SOLN: 4.0000 mg | Freq: Four times a day (QID) | INTRAMUSCULAR | Status: DC | PRN

## 2017-09-06 MED ORDER — SODIUM CHLORIDE 0.9 % IV SOLN
2.0000 g | INTRAVENOUS | Status: DC
  Administered 2017-09-07: 2 g via INTRAVENOUS
  Filled 2017-09-06: qty 20
  Filled 2017-09-06: qty 2
  Filled 2017-09-06: qty 20

## 2017-09-06 MED ORDER — PROCHLORPERAZINE EDISYLATE 10 MG/2ML IJ SOLN
5.0000 mg | Freq: Once | INTRAMUSCULAR | Status: AC
  Administered 2017-09-06: 5 mg via INTRAVENOUS
  Filled 2017-09-06: qty 2

## 2017-09-06 MED ORDER — CLONAZEPAM 0.5 MG PO TABS
0.5000 mg | ORAL_TABLET | Freq: Every day | ORAL | Status: DC
  Administered 2017-09-06 – 2017-09-08 (×3): 0.5 mg via ORAL
  Filled 2017-09-06 (×3): qty 1

## 2017-09-06 MED ORDER — ONDANSETRON HCL 4 MG/2ML IJ SOLN
4.0000 mg | Freq: Once | INTRAMUSCULAR | Status: AC
  Administered 2017-09-06: 4 mg via INTRAVENOUS
  Filled 2017-09-06: qty 2

## 2017-09-06 MED ORDER — METRONIDAZOLE IN NACL 5-0.79 MG/ML-% IV SOLN
500.0000 mg | Freq: Three times a day (TID) | INTRAVENOUS | Status: DC
  Administered 2017-09-06 – 2017-09-08 (×5): 500 mg via INTRAVENOUS
  Filled 2017-09-06 (×5): qty 100

## 2017-09-06 MED ORDER — IOPAMIDOL (ISOVUE-300) INJECTION 61%
100.0000 mL | Freq: Once | INTRAVENOUS | Status: AC | PRN
  Administered 2017-09-06: 100 mL via INTRAVENOUS

## 2017-09-06 MED ORDER — HYDROMORPHONE HCL 1 MG/ML IJ SOLN
0.5000 mg | INTRAMUSCULAR | Status: DC | PRN
  Administered 2017-09-06: 0.5 mg via INTRAVENOUS
  Filled 2017-09-06: qty 0.5

## 2017-09-06 MED ORDER — BOOST / RESOURCE BREEZE PO LIQD CUSTOM
1.0000 | Freq: Three times a day (TID) | ORAL | Status: DC
  Administered 2017-09-06 – 2017-09-07 (×3): 1 via ORAL

## 2017-09-06 MED ORDER — ONDANSETRON HCL 4 MG PO TABS: 4.0000 mg | ORAL_TABLET | Freq: Four times a day (QID) | ORAL | Status: DC | PRN

## 2017-09-06 MED ORDER — POTASSIUM CHLORIDE IN NACL 20-0.9 MEQ/L-% IV SOLN
INTRAVENOUS | Status: DC
  Administered 2017-09-06 – 2017-09-08 (×4): via INTRAVENOUS

## 2017-09-06 MED ORDER — ATORVASTATIN CALCIUM 40 MG PO TABS
40.0000 mg | ORAL_TABLET | Freq: Every day | ORAL | Status: DC
  Administered 2017-09-06 – 2017-09-07 (×2): 40 mg via ORAL
  Filled 2017-09-06 (×2): qty 1

## 2017-09-06 MED ORDER — MIRTAZAPINE 15 MG PO TABS
45.0000 mg | ORAL_TABLET | Freq: Every day | ORAL | Status: DC
  Administered 2017-09-06 – 2017-09-07 (×2): 45 mg via ORAL
  Filled 2017-09-06 (×3): qty 3

## 2017-09-06 NOTE — ED Triage Notes (Signed)
abd pain below umbilicus since Thursday. Denies n/v/d. Pt states when she does urinate she has hesitancy.

## 2017-09-06 NOTE — H&P (Signed)
History and Physical  An Lannan NLZ:[id number] DOB: 1965/11/01 DOA: 09/06/2017   PCP: Curlene Labrum, MD   Patient coming from: Home  Chief Complaint: Abdominal pain  HPI:  Pamela Pham is a 52 y.o. female with medical history of anxiety, depression, hyperlipidemia, hypothyroidism presenting with periumbilical and suprapubic abdominal pain that began on 09/03/2017.  The patient states that she has tried taking some ibuprofen with a little bit of relief.  She states that she has actually started feeling bad approximately 1 to 2 weeks prior to this admission.  She was previously attributing this to starting Paxil and gabapentin.  She has stopped taking these medications.  She has been having intermittent loose stools of small volume since 09/03/2017.  She denies any hematochezia, melena, dysuria, hematuria.  She has not had any nausea, vomiting.  She has had some subjective fevers and chills.  She denies any other new medications or over-the-counter supplements.  During this period of time, she has also had decreased oral intake without nausea or vomiting.  She denies any alcohol or illegal drug use.  She continues to smoke 1/2 pack/day.  In the emergency department, the patient was afebrile hemodynamically stable saturating 99% on room air.  BMP and LFTs were unremarkable.  WBC was 11.8.  CT of the abdomen and pelvis showed thickened sigmoid colon with surrounding inflammation.  There was a 2.8 cm low-density area of inflammation adjacent to the sigmoid colon which is felt to be phlegmon.  Assessment/Plan: Acute diverticulitis with phlegmon -Start ceftriaxone and metronidazole -General surgery consult -Clear liquids only -Start IV fluids -Judicious pain control -Antiemetics prn  Hyperlipidemia -Continue statin  Hypothyroidism -Continue Synthroid  Depression/anxiety -Continue Klonopin and mirtazapine  Tobacco abuse I have discussed tobacco cessation with the patient.  I  have counseled the patient regarding the negative impacts of continued tobacco use including but not limited to lung cancer, COPD, and cardiovascular disease.  I have discussed alternatives to tobacco and modalities that may help facilitate tobacco cessation including but not limited to biofeedback, hypnosis, and medications.  Total time spent with tobacco counseling was 4 minutes.         Past Medical History:  Diagnosis Date  . Abnormal Pap smear of cervix   . Anxiety   . Asthma   . Depression   . Dysmenorrhea   . HOH (hard of hearing)    right ear  . Hypothyroidism   . Migraines   . Substance abuse ([place])    recovering addict   Past Surgical History:  Procedure Laterality Date  . ABDOMINAL HYSTERECTOMY    . CERVICAL BIOPSY  W/ LOOP ELECTRODE EXCISION     in pts 20's  . CESAREAN SECTION  2003  . CHOLECYSTECTOMY     in late 20's  . CYSTOSCOPY N/A 12/19/2013   Procedure: CYSTOSCOPY;  Surgeon: Lyman Speller, MD;  Location: [facility];  Service: Gynecology;  Laterality: N/A;  . FOREIGN BODY REMOVAL Right 02/02/2013   Procedure: Exploration right forearm with removal foreign body;  Surgeon: Schuyler Amor, MD;  Location: [facility];  Service: Orthopedics;  Laterality: Right;  Foreign body given to patient.  Marland Kitchen ILIAC ARTERY STENT     age 20, congenital stricture of vessel  . ROBOTIC ASSISTED TOTAL HYSTERECTOMY N/A 12/19/2013   Procedure: ROBOTIC ASSISTED TOTAL HYSTERECTOMY with bilateral salpingectomy .;  Surgeon: Lyman Speller, MD;  Location: [facility];  Service: Gynecology;  Laterality: N/A;   Social  History:  reports that she has been smoking cigarettes.  She has been smoking about 0.50 packs per day. She has never used smokeless tobacco. She reports that she does not drink alcohol or use drugs.   Family History  Problem Relation Age of Onset  . Cancer Maternal Grandmother        leukemia  . Diabetes Maternal Grandmother   . Stroke Maternal  Grandmother   . Heart attack Mother   . Hypertension Maternal Grandfather   . Thyroid disease Paternal Grandmother   . Cancer Paternal Uncle      Allergies  Allergen Reactions  . Codeine   . Sulfa Antibiotics Nausea And Vomiting  . Sulfa Antibiotics Nausea Only     Prior to Admission medications   Medication Sig Start Date End Date Taking? Authorizing Provider  Albuterol Sulfate (PROAIR HFA IN) Inhale 2 puffs into the lungs as needed (wheezing or shortness of breath).    Yes [provider]  atorvastatin (LIPITOR) 40 MG tablet Take 40 mg by mouth daily.   Yes [provider]  clonazePAM (KLONOPIN) 0.5 MG tablet Take 0.5 mg by mouth daily.  06/03/13  Yes [provider]  levothyroxine (SYNTHROID, LEVOTHROID) 100 MCG tablet 2 (two) times a week.  02/10/15  Yes [provider]  levothyroxine (SYNTHROID, LEVOTHROID) 112 MCG tablet Take 112 mcg by mouth daily before breakfast.   Yes [provider]  mirtazapine (REMERON) 45 MG tablet  04/14/16  Yes [provider]  gabapentin (NEURONTIN) 100 MG capsule gabapentin 100 mg nightly for 7 days and increase to 200 mg for 7 nights and then to 300 mg for 7 nights Patient not taking: Reported on 09/06/2017 05/29/17   Megan Salon, MD  gabapentin (NEURONTIN) 300 MG capsule TAKE 1 CAPSULE BY MOUTH ONCE DAILY Patient not taking: Reported on 09/06/2017 07/27/17   Megan Salon, MD  PARoxetine (PAXIL) 10 MG tablet TAKE 1/2 (ONE-HALF) TABLET BY MOUTH ONCE DAILY FOR 4 DAYS THEN INCREASE  TO  1  TABLET  BY  MOUTH  DAILY Patient not taking: Reported on 09/06/2017 08/13/17   Megan Salon, MD    Review of Systems:  Constitutional:  No weight loss, night sweats, Fevers, chills, fatigue.  Head&Eyes: No headache.  No vision loss.  No eye pain or scotoma ENT:  No Difficulty swallowing,Tooth/dental problems,Sore throat,  No ear ache, post nasal drip,  Cardio-vascular:  No chest pain, Orthopnea, PND,  swelling in lower extremities,  dizziness, palpitations  GI:  No  nausea, vomiting, diarrhea, loss of appetite, hematochezia, melena, heartburn, indigestion, Resp:  No shortness of breath with exertion or at rest. No cough. No coughing up of blood .No wheezing.No chest wall deformity  Skin:  no rash or lesions.  GU:  no dysuria, change in color of urine, no urgency or frequency. No flank pain.  Musculoskeletal:  No joint pain or swelling. No decreased range of motion. No back pain.  Psych:  No change in mood or affect. No depression or anxiety. Neurologic: No headache, no dysesthesia, no focal weakness, no vision loss. No syncope  Physical Exam: Vitals:   09/06/17 0958 09/06/17 1000 09/06/17 1229  BP:  126/73 (!) 100/56  Pulse:  80 70  Resp:  18 14  Temp:  98.2 F (36.8 C) 98 F (36.7 C)  TempSrc:  Oral Oral  SpO2:  98% 98%  Weight: 59 kg (130 lb)    Height: 5\' 2"  (1.575 m)  General:  A&O x 3, NAD, nontoxic, pleasant/cooperative Head/Eye: No conjunctival hemorrhage, no icterus, [place]/AT, No nystagmus ENT:  No icterus,  No thrush, good dentition, no pharyngeal exudate Neck:  No masses, no lymphadenpathy, no bruits CV:  RRR, no rub, no gallop, no S3 Lung:  CTAB, good air movement, no wheeze, no rhonchi Abdomen: soft/LLQ and suprapubic pain, +BS, nondistended, no peritoneal signs Ext: No cyanosis, No rashes, No petechiae, No lymphangitis, No edema Neuro: CNII-XII intact, strength 4/5 in bilateral upper and lower extremities, no dysmetria  Labs on Admission:  Basic Metabolic Panel: Recent Labs  Lab 09/06/17 1020  NA 141  K 3.7  CL 105  CO2 27  GLUCOSE 119*  BUN 8  CREATININE 0.77  CALCIUM 9.0   Liver Function Tests: Recent Labs  Lab 09/06/17 1020  AST 13*  ALT 15  ALKPHOS 87  BILITOT 0.6  PROT 7.2  ALBUMIN 3.5   No results for input(s): LIPASE, AMYLASE in the last 168 hours. No results for input(s): AMMONIA in the last 168 hours. CBC: Recent Labs  Lab  09/06/17 1020  WBC 11.8*  NEUTROABS 9.7*  HGB 13.4  HCT 40.9  MCV 94.5  PLT 333   Coagulation Profile: No results for input(s): INR, PROTIME in the last 168 hours. Cardiac Enzymes: No results for input(s): CKTOTAL, CKMB, CKMBINDEX, TROPONINI in the last 168 hours. BNP: Invalid input(s): POCBNP CBG: No results for input(s): GLUCAP in the last 168 hours. Urine analysis:    Component Value Date/Time   COLORURINE YELLOW 09/06/2017 1020   APPEARANCEUR CLEAR 09/06/2017 1020   LABSPEC 1.009 09/06/2017 1020   PHURINE 5.0 09/06/2017 1020   GLUCOSEU NEGATIVE 09/06/2017 1020   HGBUR SMALL (A) 09/06/2017 1020   BILIRUBINUR NEGATIVE 09/06/2017 1020   KETONESUR NEGATIVE 09/06/2017 1020   PROTEINUR NEGATIVE 09/06/2017 1020   NITRITE NEGATIVE 09/06/2017 1020   LEUKOCYTESUR NEGATIVE 09/06/2017 1020   Sepsis Labs: @LABRCNTIP (procalcitonin:4,lacticidven:4) )No results found for this or any previous visit (from the past 240 hour(s)).   Radiological Exams on Admission: Ct Abdomen Pelvis W Contrast  Result Date: 09/06/2017 CLINICAL DATA:  Abdominal pain below the umbilicus since Thursday. EXAM: CT ABDOMEN AND PELVIS WITH CONTRAST TECHNIQUE: Multidetector CT imaging of the abdomen and pelvis was performed using the standard protocol following bolus administration of intravenous contrast. CONTRAST:  132mL ISOVUE-300 IOPAMIDOL (ISOVUE-300) INJECTION 61% COMPARISON:  Patient's prior CT from 2007 is not available on PACs for comparison. FINDINGS: Lower chest: Minimal atelectasis of bilateral lung bases are noted. Heart size is normal. Hepatobiliary: No focal liver abnormality is seen. Status post cholecystectomy. No biliary dilatation. Pancreas: Unremarkable. No pancreatic ductal dilatation or surrounding inflammatory changes. Spleen: Normal in size without focal abnormality. Adrenals/Urinary Tract: The adrenal glands are normal. There is no hydronephrosis bilaterally. There is a small cyst in the left  kidney. The right kidney is normal. The bladder is normal. Stomach/Bowel: Stomach is normal. There is no small bowel obstruction. The appendix is normal. There is thickening of sigmoid colon with surrounding inflammation and 2.8 cm slight low-density mass.  There is no colonic obstruction. Vascular/Lymphatic: Aortic atherosclerosis. No enlarged abdominal or pelvic lymph nodes. Reproductive: Status post hysterectomy. No adnexal masses. Other: Minimal free fluid is identified in the pelvis. Musculoskeletal: No acute or significant osseous findings. IMPRESSION: Thick walled sigmoid colon with surrounding inflammation and 2.8 cm slight low-density mass probably due to acute diverticulitis with small focal area of phlegmon. Follow-up CT scan after treatment is recommended to ensure complete resolution  and that no underlying mass is present. Electronically Signed   By: Abelardo Diesel M.D.   On: 09/06/2017 11:49        Time spent:60 minutes Code Status:   FULL Family Communication: Fianc updated at bedside Disposition Plan: expect 2-3 day hospitalization Consults called: general surgery DVT Prophylaxis: [place] Lovenox  Orson Eva, DO  [facility] Pager [phone]  If 7PM-7AM, please contact night-coverage [email or web address] Password [facility] 09/06/2017, 12:53 PM

## 2017-09-06 NOTE — ED Provider Notes (Signed)
Medical screening examination/treatment/procedure(s) were conducted as a shared visit with non-physician practitioner(s) and myself.  I personally evaluated the patient during the encounter.  Clinical Impression:   Final diagnoses:  Acute diverticulitis    Pt has had 4 days of progressive lower abd pain with subj fevers On exam has ttp in lwer and LLQ c/w diverticulitis CT shows same with assocaited phlegmon IV abx and admit Pt agreeable No signs of perforation   Noemi Chapel, MD 09/15/17 1152

## 2017-09-06 NOTE — ED Provider Notes (Signed)
*-   Sa`/*6-+  [facility] EMERGENCY DEPARTMENT Provider Note   CSN: [id number] Arrival date & time: 09/06/17  4403     History   Chief Complaint Chief Complaint  Patient presents with  . Abdominal Pain    HPI Pamela Pham is a 52 y.o. female.  Patient is a 53 year old female who presents to the emergency department with a complaint of lower abdomen pain.  The patient states this problem started 2 to 3 days ago.  The pain starts below her umbilicus, and moves toward her right lower quadrant and suprapubic area.  The pain is mostly continuous, but does seem to kind to come and go according to the patient.  There is been no vomiting or diarrhea, patient states she has had some nausea at times.  No high fever reported.  No recent injury or trauma to the lower abdomen.  No recent operations or procedures reported.  Patient denies any constipation recently.  She had a hysterectomy, and no longer has menstrual cycle.  She has not had any vaginal discharge reported.     Past Medical History:  Diagnosis Date  . Abnormal Pap smear of cervix   . Anxiety   . Asthma   . Depression   . Dysmenorrhea   . HOH (hard of hearing)    right ear  . Hypothyroidism   . Migraines   . Substance abuse [facility])    recovering addict    Patient Active Problem List   Diagnosis Date Noted  . History of drug abuse in remission 08/31/2013    Past Surgical History:  Procedure Laterality Date  . ABDOMINAL HYSTERECTOMY    . CERVICAL BIOPSY  W/ LOOP ELECTRODE EXCISION     in pts 20's  . CESAREAN SECTION  2003  . CHOLECYSTECTOMY     in late 20's  . CYSTOSCOPY N/A 12/19/2013   Procedure: CYSTOSCOPY;  Surgeon: Lyman Speller, MD;  Location: [place] [facility];  Service: Gynecology;  Laterality: N/A;  . FOREIGN BODY REMOVAL Right 02/02/2013   Procedure: Exploration right forearm with removal foreign body;  Surgeon: Schuyler Amor, MD;  Location: [place];  Service: Orthopedics;   Laterality: Right;  Foreign body given to patient.  Marland Kitchen ILIAC ARTERY STENT     age 45, congenital stricture of vessel  . ROBOTIC ASSISTED TOTAL HYSTERECTOMY N/A 12/19/2013   Procedure: ROBOTIC ASSISTED TOTAL HYSTERECTOMY with bilateral salpingectomy .;  Surgeon: Lyman Speller, MD;  Location: [place] [facility];  Service: Gynecology;  Laterality: N/A;     OB History    Gravida  3   Para  1   Term  1   Preterm  0   AB  2   Living  1     SAB  0   TAB  2   Ectopic  0   Multiple      Live Births  1            Home Medications    Prior to Admission medications   Medication Sig Start Date End Date Taking? Authorizing Provider  Albuterol Sulfate (PROAIR HFA IN) Inhale 2 puffs into the lungs as needed (wheezing or shortness of breath).     [provider]  atorvastatin (LIPITOR) 40 MG tablet Take 40 mg by mouth daily.    [provider]  clonazePAM (KLONOPIN) 0.5 MG tablet Take 0.5 mg by mouth daily.  06/03/13   [provider]  gabapentin (NEURONTIN) 100 MG capsule gabapentin  100 mg nightly for 7 days and increase to 200 mg for 7 nights and then to 300 mg for 7 nights 05/29/17   Megan Salon, MD  gabapentin (NEURONTIN) 300 MG capsule TAKE 1 CAPSULE BY MOUTH ONCE DAILY 07/27/17   Megan Salon, MD  levothyroxine (SYNTHROID, LEVOTHROID) 100 MCG tablet 2 (two) times a week.  02/10/15   [provider]  levothyroxine (SYNTHROID, LEVOTHROID) 112 MCG tablet Take 112 mcg by mouth daily before breakfast.    [provider]  mirtazapine (REMERON) 45 MG tablet  04/14/16   [provider]  Multiple Vitamins-Minerals (HAIR/SKIN/NAILS PO) Take 1 tablet by mouth daily.    [provider]  PARoxetine (PAXIL) 10 MG tablet TAKE 1/2 (ONE-HALF) TABLET BY MOUTH ONCE DAILY FOR 4 DAYS THEN INCREASE  TO  1  TABLET  BY  MOUTH  DAILY 08/13/17   Megan Salon, MD    Family History Family History  Problem Relation Age of Onset  . Cancer Maternal  Grandmother        leukemia  . Diabetes Maternal Grandmother   . Stroke Maternal Grandmother   . Heart attack Mother   . Hypertension Maternal Grandfather   . Thyroid disease Paternal Grandmother   . Cancer Paternal Uncle     Social History Social History   Tobacco Use  . Smoking status: Current Every Day Smoker    Packs/day: 0.50    Types: Cigarettes  . Smokeless tobacco: Never Used  Substance Use Topics  . Alcohol use: No    Alcohol/week: 0.0 oz  . Drug use: No    Types: "Crack" cocaine     Allergies   Codeine; Sulfa antibiotics; and Sulfa antibiotics   Review of Systems Review of Systems  Constitutional: Negative for activity change, chills and fever.       All ROS Neg except as noted in HPI  HENT: Negative for nosebleeds.   Eyes: Negative for photophobia and discharge.  Respiratory: Negative for cough, shortness of breath and wheezing.   Cardiovascular: Negative for chest pain and palpitations.  Gastrointestinal: Positive for abdominal pain and nausea. Negative for blood in stool and constipation.  Genitourinary: Negative for dysuria, frequency and hematuria.       Urine hesitancy  Musculoskeletal: Negative for arthralgias, back pain and neck pain.  Skin: Negative.   Neurological: Negative for dizziness, seizures and speech difficulty.  Psychiatric/Behavioral: Negative for confusion and hallucinations.     Physical Exam Updated Vital Signs BP 126/73 (BP Location: Left Arm)   Pulse 80   Temp 98.2 F (36.8 C) (Oral)   Resp 18   Ht 5\' 2"  (1.575 m)   Wt 59 kg (130 lb)   LMP 12/02/2013   SpO2 98%   BMI 23.78 kg/m   Physical Exam  Constitutional: She is oriented to person, place, and time. She appears well-developed and well-nourished.  Non-toxic appearance.  HENT:  Head: Normocephalic.  Right Ear: Tympanic membrane and external ear normal.  Left Ear: Tympanic membrane and external ear normal.  Eyes: Pupils are equal, round, and reactive to light. EOM  and lids are normal.  Neck: Normal range of motion. Neck supple. Carotid bruit is not present.  Cardiovascular: Normal rate, regular rhythm, normal heart sounds, intact distal pulses and normal pulses.  Pulmonary/Chest: Breath sounds normal. No respiratory distress.  Abdominal: Soft. Bowel sounds are normal. There is tenderness in the right lower quadrant and periumbilical area. There is no guarding.  Musculoskeletal: Normal range  of motion.  Lymphadenopathy:       Head (right side): No submandibular adenopathy present.       Head (left side): No submandibular adenopathy present.    She has no cervical adenopathy.  Neurological: She is alert and oriented to person, place, and time. She has normal strength. No cranial nerve deficit or sensory deficit.  Skin: Skin is warm and dry.  Psychiatric: She has a normal mood and affect. Her speech is normal.  Nursing note and vitals reviewed.    ED Treatments / Results  Labs (all labs ordered are listed, but only abnormal results are displayed) Labs Reviewed  URINALYSIS, ROUTINE W REFLEX MICROSCOPIC  COMPREHENSIVE METABOLIC PANEL  CBC WITH DIFFERENTIAL/PLATELET    EKG None  Radiology No results found.  Procedures Procedures (including critical care time)  Medications Ordered in ED Medications  ondansetron (ZOFRAN) injection 4 mg (has no administration in time range)  HYDROmorphone (DILAUDID) injection 0.5 mg (has no administration in time range)     Initial Impression / Assessment and Plan / ED Course  I have reviewed the triage vital signs and the nursing notes.  Pertinent labs & imaging results that were available during my care of the patient were reviewed by me and considered in my medical decision making (see chart for details).       Final Clinical Impressions(s) / ED Diagnoses MDM  Vital signs within normal limits.  Pulse oximetry is 98% on room air.  Within normal limits by my interpretation. Patient has had a  hysterectomy in the past.  Doubt this is related to infection or inflammation of the female organs.  We will check urinalysis for urinary tract infection, or evidence of kidney stone.  Will obtain a CT scan to evaluate for possible appendicitis, colitis, or other structural abnormalities.  Urine analysis is negative for acute problem.  Comprehensive metabolic panel shows no evidence of acute abnormality.  The complete blood count shows an elevation in white blood cells of 11,800, there is no shift to the left.  CT scan of the abdomen shows thick walled sigmoid colon with surrounding inflammation and a 2.8 mass probably related to the diverticulitis with a focal area of phlegmon.  I discussed the findings with the patient in terms of which she understands.  I discussed the case with [facility] for admission.   Final diagnoses:  Acute diverticulitis    ED Discharge Orders    None       Lily Kocher, PA-C 09/06/17 1647    Noemi Chapel, MD 09/15/17 1153

## 2017-09-07 DIAGNOSIS — K5792 Diverticulitis of intestine, part unspecified, without perforation or abscess without bleeding: Secondary | ICD-10-CM

## 2017-09-07 DIAGNOSIS — E038 Other specified hypothyroidism: Secondary | ICD-10-CM

## 2017-09-07 LAB — BASIC METABOLIC PANEL
Anion gap: 7 (ref 5–15)
BUN: 5 mg/dL — AB (ref 6–20)
CALCIUM: 8.5 mg/dL — AB (ref 8.9–10.3)
CO2: 26 mmol/L (ref 22–32)
CREATININE: 0.71 mg/dL (ref 0.44–1.00)
Chloride: 107 mmol/L (ref 101–111)
GFR calc Af Amer: >60 mL/min (ref >60)
GLUCOSE: 104 mg/dL — AB (ref 65–99)
Potassium: 3.8 mmol/L (ref 3.5–5.1)
Sodium: 140 mmol/L (ref 135–145)

## 2017-09-07 LAB — CBC
HCT: 37.3 % (ref 36.0–46.0)
Hemoglobin: 12.1 g/dL (ref 12.0–15.0)
MCH: 30.7 pg (ref 26.0–34.0)
MCHC: 32.4 g/dL (ref 30.0–36.0)
MCV: 94.7 fL (ref 78.0–100.0)
Platelets: 342 10*3/uL (ref 150–400)
RBC: 3.94 MIL/uL (ref 3.87–5.11)
RDW: 13.8 % (ref 11.5–15.5)
WBC: 9.6 10*3/uL (ref 4.0–10.5)

## 2017-09-07 MED ORDER — ALBUTEROL SULFATE HFA 108 (90 BASE) MCG/ACT IN AERS: 2.0000 | INHALATION_SPRAY | RESPIRATORY_TRACT | Status: DC | PRN

## 2017-09-07 MED ORDER — OXYCODONE HCL 5 MG PO TABS
5.0000 mg | ORAL_TABLET | ORAL | Status: DC | PRN
  Administered 2017-09-07: 5 mg via ORAL
  Filled 2017-09-07: qty 1

## 2017-09-07 MED ORDER — ALBUTEROL SULFATE (2.5 MG/3ML) 0.083% IN NEBU: 2.5000 mg | INHALATION_SOLUTION | RESPIRATORY_TRACT | Status: DC | PRN

## 2017-09-07 NOTE — Progress Notes (Signed)
Nutrition Brief Note  Patient identified on the Malnutrition Screening Tool (MST) Report  Patient presented with abdominal pain which started on Thursday per patient. She is sitting up in bed during RD visit and says she is feeling better. Surgery has assessed and is not recommending immediate surgical intervention.  Wt Readings from Last 15 Encounters:  09/06/17 136 lb 7.4 oz (61.9 kg)  05/28/17 128 lb (58.1 kg)  04/25/16 138 lb 3.2 oz (62.7 kg)  02/27/15 133 lb (60.3 kg)  02/21/15 135 lb (61.2 kg)  01/16/14 140 lb (63.5 kg)  12/26/13 137 lb (62.1 kg)  12/16/13 139 lb 9.6 oz (63.3 kg)  11/22/13 138 lb (62.6 kg)  08/30/13 142 lb (64.4 kg)  08/25/13 143 lb (64.9 kg)  08/12/13 142 lb (64.4 kg)  06/08/13 139 lb (63 kg)  02/02/13 139 lb (63 kg)  01/30/13 135 lb (61.2 kg)    Body mass index is 24.96 kg/m. Patient meets criteria for normal (borderline overweight) based on current BMI. Her weight hx indicates a gain since January. She reports usual range 135- 140 lbs.   Diet has advanced today  to Soft and patient is consuming approximately 25-50% of meals at this time. She has had 1 loose stool today after eating mashed potatoes. She is receiving Boost Breeze po TID, each supplement provides 250 kcal and 9 grams of protein.  Labs and medications reviewed.  BMP Latest Ref Rng & Units 09/07/2017 09/06/2017  Glucose 65 - 99 mg/dL 104(H) 119(H)  BUN 6 - 20 mg/dL 5(L) 8  Creatinine 0.44 - 1.00 mg/dL 0.71 0.77  Sodium 135 - 145 mmol/L 140 141  Potassium 3.5 - 5.1 mmol/L 3.8 3.7  Chloride 101 - 111 mmol/L 107 105  CO2 22 - 32 mmol/L 26 27  Calcium 8.9 - 10.3 mg/dL 8.5(L) 9.0    Scheduled meds: . atorvastatin  40 mg Oral q1800  . clonazePAM  0.5 mg Oral Daily  . enoxaparin (LOVENOX) injection  40 mg Subcutaneous Q24H  . feeding supplement  1 Container Oral TID BM  . levothyroxine  112 mcg Oral QAC breakfast  . mirtazapine  45 mg Oral QHS   No additional nutrition interventions  warranted at this time. If new nutrition issues arise, please consult RD.   Colman Cater MS,RD,CSG,LDN Office: [phone] Pager: [phone]

## 2017-09-07 NOTE — Progress Notes (Signed)
PROGRESS NOTE  Pamela Pham OHY:[id number] DOB: [birth year] DOA: 09/06/2017 PCP: Curlene Labrum, MD  Brief History:   52 y.o. female with medical history of anxiety, depression, hyperlipidemia, hypothyroidism presenting with periumbilical and suprapubic abdominal pain that began on 09/03/2017.  The patient states that she has tried taking some ibuprofen with a little bit of relief.  She states that she has actually started feeling bad approximately 1 to 2 weeks prior to this admission.  She was previously attributing this to starting Paxil and gabapentin.  She has stopped taking these medications.  She has been having intermittent loose stools of small volume since 09/03/2017.   She denies any hematochezia, melena, dysuria, hematuria.  She has not had any nausea, vomiting.  She has had some subjective fevers and chills.  She denies any other new medications or over-the-counter supplements.  During this period of time, she has also had decreased oral intake without nausea or vomiting.  She denies any alcohol or illegal drug use.  She continues to smoke 1/2 pack/day.  Assessment/Plan: Acute diverticulitis with phlegmon -Continue ceftriaxone and metronidazole -General surgery consult appreciated-->treat medically -case discussed with Dr. Dennison Bulla needs colonoscopy in 1-2 months -Clear liquids only>>>advance to soft diet -Continue IV fluids -Judicious pain control -Antiemetics prn  Hyperlipidemia -Continue statin  Hypothyroidism -Continue Synthroid  Depression/anxiety -Continue Klonopin and mirtazapine  Tobacco abuse I have discussed tobacco cessation with the patient.  I have counseled the patient regarding the negative impacts of continued tobacco use including but not limited to lung cancer, COPD, and cardiovascular disease.  I have discussed alternatives to tobacco and modalities that may help facilitate tobacco cessation including but not limited to biofeedback,  hypnosis, and medications.  Total time spent with tobacco counseling was 4 minutes.     Disposition Plan:   Home 09/08/17 if stable Family Communication:   Family at bedside updated 5/13  Consultants:  General surgery  Code Status:  FULL / DNR  DVT Prophylaxis:  [place] Heparin / [place] Lovenox   Procedures: As Listed in Progress Note Above  Antibiotics: Ceftriaxone 5/12>>> Flagyl 5/12>>>    Subjective: Patient states that abdominal pain is improving patient feels hungry.  She denies any fevers, chills, headache, chest pain shortness breath, nausea, vomiting, diarrhea.  She had a bowel movement today.  Objective: Vitals:   09/06/17 1230 09/06/17 1525 09/07/17 0525 09/07/17 1518  BP: 105/60 99/68 106/61 96/70  Pulse: 71 69 66 66  Resp:  16 18 18   Temp:  98.6 F (37 C) 98.3 F (36.8 C) 99 F (37.2 C)  TempSrc:  Oral Oral Oral  SpO2: 97% 97% 96% 98%  Weight:  61.9 kg (136 lb 7.4 oz)    Height:  5\' 2"  (1.575 m)      Intake/Output Summary (Last 24 hours) at 09/07/2017 1737 Last data filed at 09/07/2017 0300 Gross per 24 hour  Intake 1470 ml  Output -  Net 1470 ml   Weight change:  Exam:   General:  Pt is alert, follows commands appropriately, not in acute distress  HEENT: No icterus, No thrush, No neck mass, [place]/AT  Cardiovascular: RRR, S1/S2, no rubs, no gallops  Respiratory: CTA bilaterally, no wheezing, no crackles, no rhonchi  Abdomen: Soft/+BS, mild LLQ tender, non distended, no guarding  Extremities: No edema, No lymphangitis, No petechiae, No rashes, no synovitis   Data Reviewed: I have personally reviewed following labs and imaging studies Basic Metabolic Panel: Recent  Labs  Lab 09/06/17 1020 09/07/17 0608  NA 141 140  K 3.7 3.8  CL 105 107  CO2 27 26  GLUCOSE 119* 104*  BUN 8 5*  CREATININE 0.77 0.71  CALCIUM 9.0 8.5*   Liver Function Tests: Recent Labs  Lab 09/06/17 1020  AST 13*  ALT 15  ALKPHOS 87  BILITOT 0.6  PROT 7.2  ALBUMIN  3.5   No results for input(s): LIPASE, AMYLASE in the last 168 hours. No results for input(s): AMMONIA in the last 168 hours. Coagulation Profile: No results for input(s): INR, PROTIME in the last 168 hours. CBC: Recent Labs  Lab 09/06/17 1020 09/07/17 0608  WBC 11.8* 9.6  NEUTROABS 9.7*  --   HGB 13.4 12.1  HCT 40.9 37.3  MCV 94.5 94.7  PLT 333 342   Cardiac Enzymes: No results for input(s): CKTOTAL, CKMB, CKMBINDEX, TROPONINI in the last 168 hours. BNP: Invalid input(s): POCBNP CBG: No results for input(s): GLUCAP in the last 168 hours. HbA1C: No results for input(s): HGBA1C in the last 72 hours. Urine analysis:    Component Value Date/Time   COLORURINE YELLOW 09/06/2017 1020   APPEARANCEUR CLEAR 09/06/2017 1020   LABSPEC 1.009 09/06/2017 1020   PHURINE 5.0 09/06/2017 1020   GLUCOSEU NEGATIVE 09/06/2017 1020   HGBUR SMALL (A) 09/06/2017 1020   BILIRUBINUR NEGATIVE 09/06/2017 1020   KETONESUR NEGATIVE 09/06/2017 1020   PROTEINUR NEGATIVE 09/06/2017 1020   NITRITE NEGATIVE 09/06/2017 1020   LEUKOCYTESUR NEGATIVE 09/06/2017 1020   Sepsis Labs: @LABRCNTIP (procalcitonin:4,lacticidven:4) )No results found for this or any previous visit (from the past 240 hour(s)).   Scheduled Meds: . atorvastatin  40 mg Oral q1800  . clonazePAM  0.5 mg Oral Daily  . enoxaparin (LOVENOX) injection  40 mg Subcutaneous Q24H  . feeding supplement  1 Container Oral TID BM  . levothyroxine  112 mcg Oral QAC breakfast  . mirtazapine  45 mg Oral QHS   Continuous Infusions: . 0.9 % NaCl with KCl 20 mEq / L 100 mL/hr at 09/07/17 0346  . cefTRIAXone (ROCEPHIN)  IV Stopped (09/07/17 1712)  . metronidazole 500 mg (09/07/17 1624)    Procedures/Studies: Ct Abdomen Pelvis W Contrast  Result Date: 09/06/2017 CLINICAL DATA:  Abdominal pain below the umbilicus since Thursday. EXAM: CT ABDOMEN AND PELVIS WITH CONTRAST TECHNIQUE: Multidetector CT imaging of the abdomen and pelvis was performed  using the standard protocol following bolus administration of intravenous contrast. CONTRAST:  167mL ISOVUE-300 IOPAMIDOL (ISOVUE-300) INJECTION 61% COMPARISON:  Patient's prior CT from 2007 is not available on PACs for comparison. FINDINGS: Lower chest: Minimal atelectasis of bilateral lung bases are noted. Heart size is normal. Hepatobiliary: No focal liver abnormality is seen. Status post cholecystectomy. No biliary dilatation. Pancreas: Unremarkable. No pancreatic ductal dilatation or surrounding inflammatory changes. Spleen: Normal in size without focal abnormality. Adrenals/Urinary Tract: The adrenal glands are normal. There is no hydronephrosis bilaterally. There is a small cyst in the left kidney. The right kidney is normal. The bladder is normal. Stomach/Bowel: Stomach is normal. There is no small bowel obstruction. The appendix is normal. There is thickening of sigmoid colon with surrounding inflammation and 2.8 cm slight low-density mass.  There is no colonic obstruction. Vascular/Lymphatic: Aortic atherosclerosis. No enlarged abdominal or pelvic lymph nodes. Reproductive: Status post hysterectomy. No adnexal masses. Other: Minimal free fluid is identified in the pelvis. Musculoskeletal: No acute or significant osseous findings. IMPRESSION: Thick walled sigmoid colon with surrounding inflammation and 2.8 cm slight low-density mass probably  due to acute diverticulitis with small focal area of phlegmon. Follow-up CT scan after treatment is recommended to ensure complete resolution and that no underlying mass is present. Electronically Signed   By: Abelardo Diesel M.D.   On: 09/06/2017 11:49    Orson Eva, DO  [facility] Pager [phone]  If 7PM-7AM, please contact night-coverage [email or web address] Password [email or web address] 09/07/2017, 5:37 PM   LOS: 1 day

## 2017-09-07 NOTE — Consult Note (Signed)
[facility] Consult  Reason for Consult: Diverticulitis with phlegmon  Referring Physician:  Dr. Carles Collet   Chief Complaint    Abdominal Pain      Pamela Pham is a 52 y.o. female.  HPI: Pamela Pham is a 52 yo with out a prior colonoscopy who presented to the hospital with abdominal pain in the LLQ/ umbilicus since Thursday. Some minor chills but no documented fever and no nausea/vomiting. She reports having some diarrhea but now her stools are formed, and she normally has formed stools daily. She denies any blood per rectum or dark stools.   She has never had an episode of diverticulitis prior, and reports that her pain is improved and that she is hungry.   Past Medical History:  Diagnosis Date  . Abnormal Pap smear of cervix   . Anxiety   . Asthma   . Depression   . Dysmenorrhea   . HOH (hard of hearing)    right ear  . Hypothyroidism   . Migraines   . Substance abuse ([place])    recovering addict    Past Surgical History:  Procedure Laterality Date  . ABDOMINAL HYSTERECTOMY    . CERVICAL BIOPSY  W/ LOOP ELECTRODE EXCISION     in pts 20's  . CESAREAN SECTION  2003  . CHOLECYSTECTOMY     in late 20's  . CYSTOSCOPY N/A 12/19/2013   Procedure: CYSTOSCOPY;  Surgeon: Lyman Speller, MD;  Location: [facility];  Service: Gynecology;  Laterality: N/A;  . FOREIGN BODY REMOVAL Right 02/02/2013   Procedure: Exploration right forearm with removal foreign body;  Surgeon: Schuyler Amor, MD;  Location: [facility];  Service: Orthopedics;  Laterality: Right;  Foreign body given to patient.  Marland Kitchen ILIAC ARTERY STENT     age 47, congenital stricture of vessel  . ROBOTIC ASSISTED TOTAL HYSTERECTOMY N/A 12/19/2013   Procedure: ROBOTIC ASSISTED TOTAL HYSTERECTOMY with bilateral salpingectomy .;  Surgeon: Lyman Speller, MD;  Location: [facility];  Service: Gynecology;  Laterality: N/A;    Family History  Problem Relation Age of Onset  . Cancer Maternal  Grandmother        leukemia  . Diabetes Maternal Grandmother   . Stroke Maternal Grandmother   . Heart attack Mother   . Hypertension Maternal Grandfather   . Thyroid disease Paternal Grandmother   . Cancer Paternal Uncle     Social History   Tobacco Use  . Smoking status: Current Every Day Smoker    Packs/day: 0.50    Types: Cigarettes  . Smokeless tobacco: Never Used  Substance Use Topics  . Alcohol use: No    Alcohol/week: 0.0 oz  . Drug use: No    Types: "Crack" cocaine    Medications:  I have reviewed the patient's current medications. Prior to Admission:  Medications Prior to Admission  Medication Sig Dispense Refill Last Dose  . Albuterol Sulfate (PROAIR HFA IN) Inhale 2 puffs into the lungs as needed (wheezing or shortness of breath).    09/05/2017 at Unknown time  . atorvastatin (LIPITOR) 40 MG tablet Take 40 mg by mouth daily.   09/05/2017 at Unknown time  . clonazePAM (KLONOPIN) 0.5 MG tablet Take 0.5 mg by mouth daily.    09/05/2017 at Unknown time  . levothyroxine (SYNTHROID, LEVOTHROID) 100 MCG tablet 2 (two) times a week.    09/05/2017 at Unknown time  . levothyroxine (SYNTHROID, LEVOTHROID) 112 MCG tablet Take 112 mcg by mouth daily before breakfast.  09/05/2017 at Unknown time  . mirtazapine (REMERON) 45 MG tablet   0 09/05/2017 at Unknown time  . gabapentin (NEURONTIN) 100 MG capsule gabapentin 100 mg nightly for 7 days and increase to 200 mg for 7 nights and then to 300 mg for 7 nights (Patient not taking: Reported on 09/06/2017) 42 capsule 0 Not Taking at Unknown time  . gabapentin (NEURONTIN) 300 MG capsule TAKE 1 CAPSULE BY MOUTH ONCE DAILY (Patient not taking: Reported on 09/06/2017) 90 capsule 4 Not Taking at Unknown time  . PARoxetine (PAXIL) 10 MG tablet TAKE 1/2 (ONE-HALF) TABLET BY MOUTH ONCE DAILY FOR 4 DAYS THEN INCREASE  TO  1  TABLET  BY  MOUTH  DAILY (Patient not taking: Reported on 09/06/2017) 30 tablet 12 Not Taking at Unknown time   Scheduled: .  atorvastatin  40 mg Oral q1800  . clonazePAM  0.5 mg Oral Daily  . enoxaparin (LOVENOX) injection  40 mg Subcutaneous Q24H  . feeding supplement  1 Container Oral TID BM  . levothyroxine  112 mcg Oral QAC breakfast  . mirtazapine  45 mg Oral QHS   Continuous: . 0.9 % NaCl with KCl 20 mEq / L 100 mL/hr at 09/07/17 0346  . cefTRIAXone (ROCEPHIN)  IV    . metronidazole Stopped (09/07/17 5625)   WLS:[id number] **OR** acetaminophen, albuterol, HYDROmorphone (DILAUDID) injection, ondansetron **OR** ondansetron (ZOFRAN) IV, oxyCODONE  Allergies: Allergies  Allergen Reactions  . Codeine   . Sulfa Antibiotics Nausea And Vomiting  . Sulfa Antibiotics Nausea Only    ROS:  A comprehensive review of systems was negative except for: Constitutional: positive for chills Gastrointestinal: positive for abdominal pain and diarrhea  Blood pressure 106/61, pulse 66, temperature 98.3 F (36.8 C), temperature source Oral, resp. rate 18, height _0  (1.575 m), weight 136 lb 7.4 oz (61.9 kg), last menstrual period 12/02/2013, SpO2 96 %. Physical Exam  Constitutional: She is oriented to person, place, and time. She appears well-developed and well-nourished.  HENT:  Head: Normocephalic and atraumatic.  Eyes: Pupils are equal, round, and reactive to light. EOM are normal.  Cardiovascular: Normal rate.  Pulmonary/Chest: Effort normal.  Abdominal: Normal appearance and bowel sounds are normal. She exhibits no distension and no mass. There is tenderness in the left lower quadrant. No hernia.  Firm in the LLQ with palpation  Neurological: She is alert and oriented to person, place, and time.  Skin: Skin is warm and dry.  Psychiatric: She has a normal mood and affect. Her behavior is normal.  Vitals reviewed.   Results: Results for orders placed or performed during the hospital encounter of 09/06/17 (from the past 48 hour(s))  Urinalysis, Routine w reflex microscopic     Status: Abnormal    Collection Time: 09/06/17 10:20 AM  Result Value Ref Range   Color, Urine YELLOW YELLOW   APPearance CLEAR CLEAR   Specific Gravity, Urine 1.009 1.005 - 1.030   pH 5.0 5.0 - 8.0   Glucose, UA NEGATIVE NEGATIVE mg/dL   Hgb urine dipstick SMALL (A) NEGATIVE   Bilirubin Urine NEGATIVE NEGATIVE   Ketones, ur NEGATIVE NEGATIVE mg/dL   Protein, ur NEGATIVE NEGATIVE mg/dL   Nitrite NEGATIVE NEGATIVE   Leukocytes, UA NEGATIVE NEGATIVE   RBC / HPF 0-5 0 - 5 RBC/hpf   WBC, UA 0-5 0 - 5 WBC/hpf   Bacteria, UA NONE SEEN NONE SEEN   Squamous Epithelial / LPF 0-5 0 - 5   Mucus PRESENT     Comment:  Performed at [facility], [address], [place], [place] [address]  Comprehensive metabolic panel     Status: Abnormal   Collection Time: 09/06/17 10:20 AM  Result Value Ref Range   Sodium 141 135 - 145 mmol/L   Potassium 3.7 3.5 - 5.1 mmol/L   Chloride 105 101 - 111 mmol/L   CO2 27 22 - 32 mmol/L   Glucose, Bld 119 (H) 65 - 99 mg/dL   BUN 8 6 - 20 mg/dL   Creatinine, Ser 0.77 0.44 - 1.00 mg/dL   Calcium 9.0 8.9 - 10.3 mg/dL   Total Protein 7.2 6.5 - 8.1 g/dL   Albumin 3.5 3.5 - 5.0 g/dL   AST 13 (L) 15 - 41 U/L   ALT 15 14 - 54 U/L   Alkaline Phosphatase 87 38 - 126 U/L   Total Bilirubin 0.6 0.3 - 1.2 mg/dL   GFR calc non Af Amer >60 >60 mL/min   GFR calc Af Amer >60 >60 mL/min    Comment: (NOTE) The eGFR has been calculated using the CKD EPI equation. This calculation has not been validated in all clinical situations. eGFR's persistently <60 mL/min signify possible Chronic Kidney Disease.    Anion gap 9 5 - 15    Comment: Performed at [facility], [address], [place], [place] [address]  CBC with Differential     Status: Abnormal   Collection Time: 09/06/17 10:20 AM  Result Value Ref Range   WBC 11.8 (H) 4.0 - 10.5 K/uL   RBC 4.33 3.87 - 5.11 MIL/uL   Hemoglobin 13.4 12.0 - 15.0 g/dL   HCT 40.9 36.0 - 46.0 %   MCV 94.5 78.0 - 100.0 fL   MCH 30.9 26.0 - 34.0 pg   MCHC 32.8  30.0 - 36.0 g/dL   RDW 13.8 11.5 - 15.5 %   Platelets 333 150 - 400 K/uL   Neutrophils Relative % 82 %   Neutro Abs 9.7 (H) 1.7 - 7.7 K/uL   Lymphocytes Relative 12 %   Lymphs Abs 1.4 0.7 - 4.0 K/uL   Monocytes Relative 5 %   Monocytes Absolute 0.6 0.1 - 1.0 K/uL   Eosinophils Relative 1 %   Eosinophils Absolute 0.1 0.0 - 0.7 K/uL   Basophils Relative 0 %   Basophils Absolute 0.0 0.0 - 0.1 K/uL    Comment: Performed at [facility], [address], [place], [place] [address]  Basic metabolic panel     Status: Abnormal   Collection Time: 09/07/17  6:08 AM  Result Value Ref Range   Sodium 140 135 - 145 mmol/L   Potassium 3.8 3.5 - 5.1 mmol/L   Chloride 107 101 - 111 mmol/L   CO2 26 22 - 32 mmol/L   Glucose, Bld 104 (H) 65 - 99 mg/dL   BUN 5 (L) 6 - 20 mg/dL   Creatinine, Ser 0.71 0.44 - 1.00 mg/dL   Calcium 8.5 (L) 8.9 - 10.3 mg/dL   GFR calc non Af Amer >60 >60 mL/min   GFR calc Af Amer >60 >60 mL/min    Comment: (NOTE) The eGFR has been calculated using the CKD EPI equation. This calculation has not been validated in all clinical situations. eGFR's persistently <60 mL/min signify possible Chronic Kidney Disease.    Anion gap 7 5 - 15    Comment: Performed at [facility], [address], [place], [place] [address]  CBC     Status: None   Collection Time: 09/07/17  6:08 AM  Result Value Ref Range   WBC 9.6 4.0 - 10.5 K/uL   RBC 3.94 3.87 - 5.11 MIL/uL   Hemoglobin 12.1 12.0 - 15.0 g/dL   HCT 37.3 36.0 - 46.0 %   MCV 94.7 78.0 - 100.0 fL   MCH 30.7 26.0 - 34.0 pg   MCHC 32.4 30.0 - 36.0 g/dL   RDW 13.8 11.5 - 15.5 %   Platelets 342 150 - 400 K/uL    Comment: Performed at [facility], [address] [place], [place] [address]   Personally reviewed CT- inflamed sigmoid colon with stranding of the mesentery, intramural ? Abscess versus phlegmon versus mass, no extraluminal collections noted, no free air   Ct Abdomen Pelvis W Contrast  Result Date: 09/06/2017 CLINICAL  DATA:  Abdominal pain below the umbilicus since Thursday. EXAM: CT ABDOMEN AND PELVIS WITH CONTRAST TECHNIQUE: Multidetector CT imaging of the abdomen and pelvis was performed using the standard protocol following bolus administration of intravenous contrast. CONTRAST:  148m ISOVUE-300 IOPAMIDOL (ISOVUE-300) INJECTION 61% COMPARISON:  Patient's prior CT from 2007 is not available on PACs for comparison. FINDINGS: Lower chest: Minimal atelectasis of bilateral lung bases are noted. Heart size is normal. Hepatobiliary: No focal liver abnormality is seen. Status post cholecystectomy. No biliary dilatation. Pancreas: Unremarkable. No pancreatic ductal dilatation or surrounding inflammatory changes. Spleen: Normal in size without focal abnormality. Adrenals/Urinary Tract: The adrenal glands are normal. There is no hydronephrosis bilaterally. There is a small cyst in the left kidney. The right kidney is normal. The bladder is normal. Stomach/Bowel: Stomach is normal. There is no small bowel obstruction. The appendix is normal. There is thickening of sigmoid colon with surrounding inflammation and 2.8 cm slight low-density mass.  There is no colonic obstruction. Vascular/Lymphatic: Aortic atherosclerosis. No enlarged abdominal or pelvic lymph nodes. Reproductive: Status post hysterectomy. No adnexal masses. Other: Minimal free fluid is identified in the pelvis. Musculoskeletal: No acute or significant osseous findings. IMPRESSION: Thick walled sigmoid colon with surrounding inflammation and 2.8 cm slight low-density mass probably due to acute diverticulitis with small focal area of phlegmon. Follow-up CT scan after treatment is recommended to ensure complete resolution and that no underlying mass is present. Electronically Signed   By: WAbelardo DieselM.D.   On: 09/06/2017 11:49     Assessment & Plan:  KMardell Suttlesis a 52y.o. female with sigmoid diverticulitis with phlegmon and no drainable collection. This is her  first episode of diverticulitis. Never had a colonoscopy. Pain improved but somewhat tender.  - Clear liquids, adv as tolerated - IV antibiotics for today, would send home on po antibiotics Augmentin versus Cipro/ Flagyl  - Will need colonoscopy in the upcoming weeks, wants to be referred to Dr. RLaural Golden  - Potentially home tomorrow if doing better, tolerating diet, pain controlled on orals  - No surgical intervention needed at this time   All questions were answered to the satisfaction of the patient and family.     LVirl Cagey5/13/2019, 2:09 PM

## 2017-09-08 LAB — BASIC METABOLIC PANEL
Anion gap: 6 (ref 5–15)
BUN: 6 mg/dL (ref 6–20)
CHLORIDE: 107 mmol/L (ref 101–111)
CO2: 26 mmol/L (ref 22–32)
Calcium: 8.1 mg/dL — ABNORMAL LOW (ref 8.9–10.3)
Creatinine, Ser: 0.76 mg/dL (ref 0.44–1.00)
GFR calc Af Amer: >60 mL/min (ref >60)
Glucose, Bld: 90 mg/dL (ref 65–99)
POTASSIUM: 3.9 mmol/L (ref 3.5–5.1)
SODIUM: 139 mmol/L (ref 135–145)

## 2017-09-08 LAB — CBC
HEMATOCRIT: 33.4 % — AB (ref 36.0–46.0)
Hemoglobin: 10.9 g/dL — ABNORMAL LOW (ref 12.0–15.0)
MCH: 31.1 pg (ref 26.0–34.0)
MCHC: 32.6 g/dL (ref 30.0–36.0)
MCV: 95.2 fL (ref 78.0–100.0)
Platelets: 292 10*3/uL (ref 150–400)
RBC: 3.51 MIL/uL — ABNORMAL LOW (ref 3.87–5.11)
RDW: 14.1 % (ref 11.5–15.5)
WBC: 9 10*3/uL (ref 4.0–10.5)

## 2017-09-08 LAB — HIV ANTIBODY (ROUTINE TESTING W REFLEX): HIV SCREEN 4TH GENERATION: NONREACTIVE

## 2017-09-08 MED ORDER — METRONIDAZOLE 500 MG PO TABS: 500.0000 mg | ORAL_TABLET | Freq: Three times a day (TID) | ORAL | 0 refills | Status: DC

## 2017-09-08 MED ORDER — CIPROFLOXACIN HCL 500 MG PO TABS: 500.0000 mg | ORAL_TABLET | Freq: Two times a day (BID) | ORAL | 0 refills | Status: DC

## 2017-09-08 MED ORDER — TRAMADOL HCL 50 MG PO TABS: 50.0000 mg | ORAL_TABLET | Freq: Four times a day (QID) | ORAL | 0 refills | Status: DC | PRN

## 2017-09-08 NOTE — Progress Notes (Addendum)
[facility] Progress Note     Subjective: No major complaints. Pain controlled. Feeling good and eating, reports BMs.   Objective: Vital signs in last 24 hours: Temp:  [99 F (37.2 C)] 99 F (37.2 C) (05/14 0606) Pulse Rate:  [66-75] 75 (05/14 0606) Resp:  [18-20] 20 (05/14 0606) BP: (96-111)/(68-70) 111/68 (05/14 0606) SpO2:  [96 %-98 %] 96 % (05/14 0606) Last BM Date: 09/07/17  Intake/Output from previous day: No intake/output data recorded. Intake/Output this shift: No intake/output data recorded.  General appearance: alert, cooperative and no distress Resp: normal work breathing GI: soft, tender LLQ but improved, no rebound or guarding, nondistended  Lab Results:  Recent Labs    09/07/17 0608 09/08/17 0442  WBC 9.6 9.0  HGB 12.1 10.9*  HCT 37.3 33.4*  PLT 342 292   BMET Recent Labs    09/07/17 0608 09/08/17 0442  NA 140 139  K 3.8 3.9  CL 107 107  CO2 26 26  GLUCOSE 104* 90  BUN 5* 6  CREATININE 0.71 0.76  CALCIUM 8.5* 8.1*   Studies/Results: Ct Abdomen Pelvis W Contrast  Result Date: 09/06/2017 CLINICAL DATA:  Abdominal pain below the umbilicus since Thursday. EXAM: CT ABDOMEN AND PELVIS WITH CONTRAST TECHNIQUE: Multidetector CT imaging of the abdomen and pelvis was performed using the standard protocol following bolus administration of intravenous contrast. CONTRAST:  11mL ISOVUE-300 IOPAMIDOL (ISOVUE-300) INJECTION 61% COMPARISON:  Patient's prior CT from 2007 is not available on PACs for comparison. FINDINGS: Lower chest: Minimal atelectasis of bilateral lung bases are noted. Heart size is normal. Hepatobiliary: No focal liver abnormality is seen. Status post cholecystectomy. No biliary dilatation. Pancreas: Unremarkable. No pancreatic ductal dilatation or surrounding inflammatory changes. Spleen: Normal in size without focal abnormality. Adrenals/Urinary Tract: The adrenal glands are normal. There is no hydronephrosis bilaterally.  There is a small cyst in the left kidney. The right kidney is normal. The bladder is normal. Stomach/Bowel: Stomach is normal. There is no small bowel obstruction. The appendix is normal. There is thickening of sigmoid colon with surrounding inflammation and 2.8 cm slight low-density mass.  There is no colonic obstruction. Vascular/Lymphatic: Aortic atherosclerosis. No enlarged abdominal or pelvic lymph nodes. Reproductive: Status post hysterectomy. No adnexal masses. Other: Minimal free fluid is identified in the pelvis. Musculoskeletal: No acute or significant osseous findings. IMPRESSION: Thick walled sigmoid colon with surrounding inflammation and 2.8 cm slight low-density mass probably due to acute diverticulitis with small focal area of phlegmon. Follow-up CT scan after treatment is recommended to ensure complete resolution and that no underlying mass is present. Electronically Signed   By: Pamela Pham M.D.   On: 09/06/2017 11:49    Anti-infectives: Anti-infectives (From admission, onward)   Start     Dose/Rate Route Frequency Ordered Stop   09/07/17 1200  cefTRIAXone (ROCEPHIN) 2 g in sodium chloride 0.9 % 100 mL IVPB     2 g 200 mL/hr over 30 Minutes Intravenous Every 24 hours 09/06/17 1524     09/06/17 2100  metroNIDAZOLE (FLAGYL) IVPB 500 mg     500 mg 100 mL/hr over 60 Minutes Intravenous Every 8 hours 09/06/17 1524     09/06/17 1600  cefTRIAXone (ROCEPHIN) 1 g in sodium chloride 0.9 % 100 mL IVPB     1 g 200 mL/hr over 30 Minutes Intravenous  Once 09/06/17 1549 09/06/17 1722   09/06/17 1215  cefTRIAXone (ROCEPHIN) 1 g in sodium chloride 0.9 % 100 mL IVPB     1  g 200 mL/hr over 30 Minutes Intravenous  Once 09/06/17 1204 09/06/17 1259   09/06/17 1215  metroNIDAZOLE (FLAGYL) IVPB 500 mg     500 mg 100 mL/hr over 60 Minutes Intravenous  Once 09/06/17 1204 09/06/17 1425      Assessment/Plan: [name] is a 52 yo with diverticulitis and phelgmon who has never had a colonoscopy.  Doing well. -Transition to oral antibiotics, will need 14 day course total, Augmentin /Cipro+ Flagyl either option ok -Diet as tolerated -Can d/c home would send with a few pain meds  -See me in 2 weeks, will refer to Dr. Laural Pham for colonoscopy    LOS: 2 days    Pamela Pham 09/08/2017

## 2017-09-08 NOTE — Discharge Summary (Signed)
Physician Discharge Summary  Pamela Pham ZDG:[id number] DOB: [birth year] DOA: 09/06/2017  PCP: Curlene Labrum, MD  Admit date: 09/06/2017 Discharge date: 09/08/2017  Admitted From: Home Disposition:  Home   Recommendations for Outpatient Follow-up:  1. Follow up with PCP in 1-2 weeks 2. Please obtain BMP/CBC in one week     Discharge Condition: Stable CODE STATUS:FULL Diet recommendation: soft   Brief/Interim Summary: 52 y.o.femalewith medical history ofanxiety, depression, hyperlipidemia, hypothyroidism presenting with periumbilical and suprapubic abdominal pain that began on 09/03/2017. The patient states that she has tried taking some ibuprofen with a little bit of relief. She states that she has actually started feeling bad approximately 1 to 2 weeks prior to this admission. She was previously attributing this to starting Paxil and gabapentin. She has stopped taking these medications. She has been having intermittent loose stools of small volume since 09/03/2017.  She denies any hematochezia, melena, dysuria, hematuria. She has not had any nausea, vomiting. She has had some subjective fevers and chills. She denies any other new medications or over-the-counter supplements. During this period of time, she has also had decreased oral intake without nausea or vomiting. She denies any alcohol or illegal drug use. She continues to smoke 1/2 pack/day.     Discharge Diagnoses:  Acute diverticulitis with phlegmon -Continue ceftriaxone and metronidazole -General surgery consult appreciated-->treat medically -case discussed with Dr. Dennison Bulla needs colonoscopy in 1-2 months -Clear liquids only>>>advance to soft diet which pt tolerated -Continue IV fluids -d/c home with cipro and flagyl x 10 more days -Judicious pain control -Antiemeticsprn -tramadol 50 mg, #15, no RF  Hyperlipidemia -Continue statin  Hypothyroidism -Continue  Synthroid  Depression/anxiety -Continue Klonopin and mirtazapine  Tobacco abuse I have discussed tobacco cessation with the patient. I have counseled the patient regarding the negative impacts of continued tobacco use including but not limited to lung cancer, COPD, and cardiovascular disease. I have discussed alternatives to tobacco and modalities that may help facilitate tobacco cessation including but not limited to biofeedback, hypnosis, and medications. Total time spent with tobacco counseling was 4 minutes.      Discharge Instructions   Allergies as of 09/08/2017      Reactions   Codeine    Sulfa Antibiotics Nausea And Vomiting   Sulfa Antibiotics Nausea Only      Medication List    STOP taking these medications   gabapentin 100 MG capsule Commonly known as:  NEURONTIN   gabapentin 300 MG capsule Commonly known as:  NEURONTIN   PARoxetine 10 MG tablet Commonly known as:  PAXIL     TAKE these medications   atorvastatin 40 MG tablet Commonly known as:  LIPITOR Take 40 mg by mouth daily.   ciprofloxacin 500 MG tablet Commonly known as:  CIPRO Take 1 tablet (500 mg total) by mouth 2 (two) times daily.   clonazePAM 0.5 MG tablet Commonly known as:  KLONOPIN Take 0.5 mg by mouth daily.   levothyroxine 112 MCG tablet Commonly known as:  SYNTHROID, LEVOTHROID Take 112 mcg by mouth daily before breakfast.   levothyroxine 100 MCG tablet Commonly known as:  SYNTHROID, LEVOTHROID 2 (two) times a week.   metroNIDAZOLE 500 MG tablet Commonly known as:  FLAGYL Take 1 tablet (500 mg total) by mouth 3 (three) times daily.   mirtazapine 45 MG tablet Commonly known as:  REMERON   PROAIR HFA IN Inhale 2 puffs into the lungs as needed (wheezing or shortness of breath).   traMADol 50 MG tablet Commonly known  as:  ULTRAM Take 1 tablet (50 mg total) by mouth every 6 (six) hours as needed for moderate pain.       Allergies  Allergen Reactions  . Codeine    . Sulfa Antibiotics Nausea And Vomiting  . Sulfa Antibiotics Nausea Only    Consultations:  General surgery   Procedures/Studies: Ct Abdomen Pelvis W Contrast  Result Date: 09/06/2017 CLINICAL DATA:  Abdominal pain below the umbilicus since Thursday. EXAM: CT ABDOMEN AND PELVIS WITH CONTRAST TECHNIQUE: Multidetector CT imaging of the abdomen and pelvis was performed using the standard protocol following bolus administration of intravenous contrast. CONTRAST:  134mL ISOVUE-300 IOPAMIDOL (ISOVUE-300) INJECTION 61% COMPARISON:  Patient's prior CT from 2007 is not available on PACs for comparison. FINDINGS: Lower chest: Minimal atelectasis of bilateral lung bases are noted. Heart size is normal. Hepatobiliary: No focal liver abnormality is seen. Status post cholecystectomy. No biliary dilatation. Pancreas: Unremarkable. No pancreatic ductal dilatation or surrounding inflammatory changes. Spleen: Normal in size without focal abnormality. Adrenals/Urinary Tract: The adrenal glands are normal. There is no hydronephrosis bilaterally. There is a small cyst in the left kidney. The right kidney is normal. The bladder is normal. Stomach/Bowel: Stomach is normal. There is no small bowel obstruction. The appendix is normal. There is thickening of sigmoid colon with surrounding inflammation and 2.8 cm slight low-density mass.  There is no colonic obstruction. Vascular/Lymphatic: Aortic atherosclerosis. No enlarged abdominal or pelvic lymph nodes. Reproductive: Status post hysterectomy. No adnexal masses. Other: Minimal free fluid is identified in the pelvis. Musculoskeletal: No acute or significant osseous findings. IMPRESSION: Thick walled sigmoid colon with surrounding inflammation and 2.8 cm slight low-density mass probably due to acute diverticulitis with small focal area of phlegmon. Follow-up CT scan after treatment is recommended to ensure complete resolution and that no underlying mass is present.  Electronically Signed   By: Abelardo Diesel M.D.   On: 09/06/2017 11:49        Discharge Exam: Vitals:   09/07/17 1518 09/08/17 0606  BP: 96/70 111/68  Pulse: 66 75  Resp: 18 20  Temp: 99 F (37.2 C) 99 F (37.2 C)  SpO2: 98% 96%   Vitals:   09/06/17 1525 09/07/17 0525 09/07/17 1518 09/08/17 0606  BP: 99/68 106/61 96/70 111/68  Pulse: 69 66 66 75  Resp: 16 18 18 20   Temp: 98.6 F (37 C) 98.3 F (36.8 C) 99 F (37.2 C) 99 F (37.2 C)  TempSrc: Oral Oral Oral Oral  SpO2: 97% 96% 98% 96%  Weight: 61.9 kg (136 lb 7.4 oz)     Height: 5\' 2"  (1.575 m)       General: Pt is alert, awake, not in acute distress Cardiovascular: RRR, S1/S2 +, no rubs, no gallops Respiratory: CTA bilaterally, no wheezing, no rhonchi Abdominal: Soft, NT, ND, bowel sounds + Extremities: no edema, no cyanosis   The results of significant diagnostics from this hospitalization (including imaging, microbiology, ancillary and laboratory) are listed below for reference.    Significant Diagnostic Studies: Ct Abdomen Pelvis W Contrast  Result Date: 09/06/2017 CLINICAL DATA:  Abdominal pain below the umbilicus since Thursday. EXAM: CT ABDOMEN AND PELVIS WITH CONTRAST TECHNIQUE: Multidetector CT imaging of the abdomen and pelvis was performed using the standard protocol following bolus administration of intravenous contrast. CONTRAST:  159mL ISOVUE-300 IOPAMIDOL (ISOVUE-300) INJECTION 61% COMPARISON:  Patient's prior CT from 2007 is not available on PACs for comparison. FINDINGS: Lower chest: Minimal atelectasis of bilateral lung bases are noted. Heart size is  normal. Hepatobiliary: No focal liver abnormality is seen. Status post cholecystectomy. No biliary dilatation. Pancreas: Unremarkable. No pancreatic ductal dilatation or surrounding inflammatory changes. Spleen: Normal in size without focal abnormality. Adrenals/Urinary Tract: The adrenal glands are normal. There is no hydronephrosis bilaterally. There is a  small cyst in the left kidney. The right kidney is normal. The bladder is normal. Stomach/Bowel: Stomach is normal. There is no small bowel obstruction. The appendix is normal. There is thickening of sigmoid colon with surrounding inflammation and 2.8 cm slight low-density mass.  There is no colonic obstruction. Vascular/Lymphatic: Aortic atherosclerosis. No enlarged abdominal or pelvic lymph nodes. Reproductive: Status post hysterectomy. No adnexal masses. Other: Minimal free fluid is identified in the pelvis. Musculoskeletal: No acute or significant osseous findings. IMPRESSION: Thick walled sigmoid colon with surrounding inflammation and 2.8 cm slight low-density mass probably due to acute diverticulitis with small focal area of phlegmon. Follow-up CT scan after treatment is recommended to ensure complete resolution and that no underlying mass is present. Electronically Signed   By: Abelardo Diesel M.D.   On: 09/06/2017 11:49     Microbiology: No results found for this or any previous visit (from the past 240 hour(s)).   Labs: Basic Metabolic Panel: Recent Labs  Lab 09/06/17 1020 09/07/17 0608 09/08/17 0442  NA 141 140 139  K 3.7 3.8 3.9  CL 105 107 107  CO2 27 26 26   GLUCOSE 119* 104* 90  BUN 8 5* 6  CREATININE 0.77 0.71 0.76  CALCIUM 9.0 8.5* 8.1*   Liver Function Tests: Recent Labs  Lab 09/06/17 1020  AST 13*  ALT 15  ALKPHOS 87  BILITOT 0.6  PROT 7.2  ALBUMIN 3.5   No results for input(s): LIPASE, AMYLASE in the last 168 hours. No results for input(s): AMMONIA in the last 168 hours. CBC: Recent Labs  Lab 09/06/17 1020 09/07/17 0608 09/08/17 0442  WBC 11.8* 9.6 9.0  NEUTROABS 9.7*  --   --   HGB 13.4 12.1 10.9*  HCT 40.9 37.3 33.4*  MCV 94.5 94.7 95.2  PLT 333 342 292   Cardiac Enzymes: No results for input(s): CKTOTAL, CKMB, CKMBINDEX, TROPONINI in the last 168 hours. BNP: Invalid input(s): POCBNP CBG: No results for input(s): GLUCAP in the last 168  hours.  Time coordinating discharge:  36 minutes  Signed:  Orson Eva, DO [facility] Pager: [phone] 09/08/2017, 1:53 PM

## 2017-09-14 ENCOUNTER — Encounter: Payer: Self-pay | Admitting: Obstetrics & Gynecology

## 2017-09-22 ENCOUNTER — Encounter: Payer: Self-pay | Admitting: General Surgery

## 2017-09-22 ENCOUNTER — Ambulatory Visit (INDEPENDENT_AMBULATORY_CARE_PROVIDER_SITE_OTHER): Payer: BLUE CROSS/BLUE SHIELD | Admitting: General Surgery

## 2017-09-22 VITALS — BP 104/61 | HR 72 | Temp 97.7°F | Resp 16 | Wt 134.0 lb

## 2017-09-22 DIAGNOSIS — K5792 Diverticulitis of intestine, part unspecified, without perforation or abscess without bleeding: Secondary | ICD-10-CM | POA: Diagnosis not present

## 2017-09-22 NOTE — Patient Instructions (Addendum)
Need colonoscopy in the upcoming weeks. Referral to Dr. Laural Golden.   Diverticulosis Diverticulosis is a condition that develops when small pouches (diverticula) form in the wall of the large intestine (colon). The colon is where water is absorbed and stool is formed. The pouches form when the inside layer of the colon pushes through weak spots in the outer layers of the colon. You may have a few pouches or many of them. What are the causes? The cause of this condition is not known. What increases the risk? The following factors may make you more likely to develop this condition:  Being older than age 76. Your risk for this condition increases with age. Diverticulosis is rare among people younger than age 23. By age 44, many people have it.  Eating a low-fiber diet.  Having frequent constipation.  Being overweight.  Not getting enough exercise.  Smoking.  Taking over-the-counter pain medicines, like aspirin and ibuprofen.  Having a family history of diverticulosis.  What are the signs or symptoms? In most people, there are no symptoms of this condition. If you do have symptoms, they may include:  Bloating.  Cramps in the abdomen.  Constipation or diarrhea.  Pain in the lower left side of the abdomen.  How is this diagnosed? This condition is most often diagnosed during an exam for other colon problems. Because diverticulosis usually has no symptoms, it often cannot be diagnosed independently. This condition may be diagnosed by:  Using a flexible scope to examine the colon (colonoscopy).  Taking an X-ray of the colon after dye has been put into the colon (barium enema).  Doing a CT scan.  How is this treated? You may not need treatment for this condition if you have never developed an infection related to diverticulosis. If you have had an infection before, treatment may include:  Eating a high-fiber diet. This may include eating more fruits, vegetables, and  grains.  Taking a fiber supplement.  Taking a live bacteria supplement (probiotic).  Taking medicine to relax your colon.  Taking antibiotic medicines.  Follow these instructions at home:  Drink 6-8 glasses of water or more each day to prevent constipation.  Try not to strain when you have a bowel movement.  If you have had an infection before: ? Eat more fiber as directed by your health care provider or your diet and nutrition specialist (dietitian). ? Take a fiber supplement or probiotic, if your health care provider approves.  Take over-the-counter and prescription medicines only as told by your health care provider.  If you were prescribed an antibiotic, take it as told by your health care provider. Do not stop taking the antibiotic even if you start to feel better.  Keep all follow-up visits as told by your health care provider. This is important. Contact a health care provider if:  You have pain in your abdomen.  You have bloating.  You have cramps.  You have not had a bowel movement in 3 days. Get help right away if:  Your pain gets worse.  Your bloating becomes very bad.  You have a fever or chills, and your symptoms suddenly get worse.  You vomit.  You have bowel movements that are bloody or black.  You have bleeding from your rectum. Summary  Diverticulosis is a condition that develops when small pouches (diverticula) form in the wall of the large intestine (colon).  You may have a few pouches or many of them.  This condition is most often diagnosed during  an exam for other colon problems.  If you have had an infection related to diverticulosis, treatment may include increasing the fiber in your diet, taking supplements, or taking medicines. This information is not intended to replace advice given to you by your health care provider. Make sure you discuss any questions you have with your health care provider. Document Released: 01/10/2004 Document  Revised: 03/03/2016 Document Reviewed: 03/03/2016 Elsevier Interactive Patient Education  2017 [employer]   Diverticulitis Diverticulitis is infection or inflammation of small pouches (diverticula) in the colon that form due to a condition called diverticulosis. Diverticula can trap stool (feces) and bacteria, causing infection and inflammation. Diverticulitis may cause severe stomach pain and diarrhea. It may lead to tissue damage in the colon that causes bleeding. The diverticula may also burst (rupture) and cause infected stool to enter other areas of the abdomen. Complications of diverticulitis can include:  Bleeding.  Severe infection.  Severe pain.  Rupture (perforation) of the colon.  Blockage (obstruction) of the colon.  What are the causes? This condition is caused by stool becoming trapped in the diverticula, which allows bacteria to grow in the diverticula. This leads to inflammation and infection. What increases the risk? You are more likely to develop this condition if:  You have diverticulosis. The risk for diverticulosis increases if: ? You are overweight or obese. ? You use tobacco products. ? You do not get enough exercise.  You eat a diet that does not include enough fiber. High-fiber foods include fruits, vegetables, beans, nuts, and whole grains.  What are the signs or symptoms? Symptoms of this condition may include:  Pain and tenderness in the abdomen. The pain is normally located on the left side of the abdomen, but it may occur in other areas.  Fever and chills.  Bloating.  Cramping.  Nausea.  Vomiting.  Changes in bowel routines.  Blood in your stool.  How is this diagnosed? This condition is diagnosed based on:  Your medical history.  A physical exam.  Tests to make sure there is nothing else causing your condition. These tests may include: ? Blood tests. ? Urine tests. ? Imaging tests of the abdomen, including X-rays,  ultrasounds, MRIs, or CT scans.  How is this treated? Most cases of this condition are mild and can be treated at home. Treatment may include:  Taking over-the-counter pain medicines.  Following a clear liquid diet.  Taking antibiotic medicines by mouth.  Rest.  More severe cases may need to be treated at a hospital. Treatment may include:  Not eating or drinking.  Taking prescription pain medicine.  Receiving antibiotic medicines through an IV tube.  Receiving fluids and nutrition through an IV tube.  Surgery.  When your condition is under control, your health care provider may recommend that you have a colonoscopy. This is an exam to look at the entire large intestine. During the exam, a lubricated, bendable tube is inserted into the anus and then passed into the rectum, colon, and other parts of the large intestine. A colonoscopy can show how severe your diverticula are and whether something else may be causing your symptoms. Follow these instructions at home: Medicines  Take over-the-counter and prescription medicines only as told by your health care provider. These include fiber supplements, probiotics, and stool softeners.  If you were prescribed an antibiotic medicine, take it as told by your health care provider. Do not stop taking the antibiotic even if you start to feel better.  Do not drive  or use heavy machinery while taking prescription pain medicine. General instructions  Follow a full liquid diet or another diet as directed by your health care provider. After your symptoms improve, your health care provider may tell you to change your diet. He or she may recommend that you eat a diet that contains at least 25 g (25 grams) of fiber daily. Fiber makes it easier to pass stool. Healthy sources of fiber include: ? Berries. One cup contains 4-8 grams of fiber. ? Beans or lentils. One half cup contains 5-8 grams of fiber. ? Green vegetables. One cup contains 4 grams of  fiber.  Exercise for at least 30 minutes, 3 times each week. You should exercise hard enough to raise your heart rate and break a sweat.  Keep all follow-up visits as told by your health care provider. This is important. You may need a colonoscopy. Contact a health care provider if:  Your pain does not improve.  You have a hard time drinking or eating food.  Your bowel movements do not return to normal. Get help right away if:  Your pain gets worse.  Your symptoms do not get better with treatment.  Your symptoms suddenly get worse.  You have a fever.  You vomit more than one time.  You have stools that are bloody, black, or tarry. Summary  Diverticulitis is infection or inflammation of small pouches (diverticula) in the colon that form due to a condition called diverticulosis. Diverticula can trap stool (feces) and bacteria, causing infection and inflammation.  You are at higher risk for this condition if you have diverticulosis and you eat a diet that does not include enough fiber.  Most cases of this condition are mild and can be treated at home. More severe cases may need to be treated at a hospital.  When your condition is under control, your health care provider may recommend that you have an exam called a colonoscopy. This exam can show how severe your diverticula are and whether something else may be causing your symptoms. This information is not intended to replace advice given to you by your health care provider. Make sure you discuss any questions you have with your health care provider. Document Released: 01/22/2005 Document Revised: 05/17/2016 Document Reviewed: 05/17/2016 Elsevier Interactive Patient Education  [employer].

## 2017-09-22 NOTE — Progress Notes (Signed)
[facility] Note   HPI:  52 y.o. Female presents to clinic for follow-up evaluation after being hospitalized with diverticulitis. She is otherwise doing fair. She has been having some nausea with the flagyl but is feeling better after completing this antibiotic. She denies any fevers or chills.  Review of Systems:  Tolerating diet Nausea better with completion of flagyl BMs 3-4 times in AM, slightly more than normal  All other review of systems: otherwise negative   Vital Signs:  BP 104/61 (BP Location: Left Arm, Patient Position: Sitting, Cuff Size: Normal)   Pulse 72   Temp 97.7 F (36.5 C) (Temporal)   Resp 16   Wt 134 lb (60.8 kg)   LMP 12/02/2013   BMI 24.51 kg/m    Physical Exam:  Physical Exam  Constitutional: She is oriented to person, place, and time. She appears well-developed.  HENT:  Head: Normocephalic.  Neck: Normal range of motion.  Cardiovascular: Normal rate.  Pulmonary/Chest: Effort normal.  Abdominal: Soft. She exhibits no distension. There is no tenderness.  Musculoskeletal: Normal range of motion.  Neurological: She is alert and oriented to person, place, and time.  Skin: Skin is warm and dry.  Vitals reviewed.   Laboratory studies: None   Imaging:  None    Assessment:  52 y.o. yo Female with a history of diverticulitis with a phlegmon versus thickened colon versus mass on CT scan. Did not require any drain placement. This was her first episode.   Plan:  - Referral to Dr. Laural Golden for colonoscopy, patient preference   - Discussed diverticulitis spectrum and her being in the middle of the spectrum due to admission and phelgmon  - Discussed option of surgery, which is something that we can discuss in the future if she has more episodes, pain, or worsening issues  -Discussed that we cannot predict if she will get diverticulitis again, also discussed that this area could be a cancer, and this is the reason for the colonoscopy   -Discussed that a repeat CT not necessary at this time given improvement clinically, but if has more pain or fevers, etc, will repeat CT to see interval changes  - Follow up with me PRN   All of the above recommendations were discussed with the patient and patient's family, and all of patient's and family's questions were answered to their expressed satisfaction.  Curlene Labrum, MD [facility] [address] [place], [place] [address] [phone] (office)

## 2017-09-29 DIAGNOSIS — E039 Hypothyroidism, unspecified: Secondary | ICD-10-CM | POA: Diagnosis not present

## 2017-11-17 ENCOUNTER — Encounter (INDEPENDENT_AMBULATORY_CARE_PROVIDER_SITE_OTHER): Payer: Self-pay | Admitting: Internal Medicine

## 2017-11-17 ENCOUNTER — Ambulatory Visit (INDEPENDENT_AMBULATORY_CARE_PROVIDER_SITE_OTHER): Payer: BLUE CROSS/BLUE SHIELD | Admitting: Internal Medicine

## 2017-11-17 VITALS — BP 118/70 | HR 65 | Temp 97.6°F | Resp 18 | Ht 62.0 in | Wt 138.6 lb

## 2017-11-17 DIAGNOSIS — K582 Mixed irritable bowel syndrome: Secondary | ICD-10-CM | POA: Diagnosis not present

## 2017-11-17 DIAGNOSIS — Z8719 Personal history of other diseases of the digestive system: Secondary | ICD-10-CM

## 2017-11-17 MED ORDER — DICYCLOMINE HCL 10 MG PO CAPS: 10.0000 mg | ORAL_CAPSULE | Freq: Three times a day (TID) | ORAL | 2 refills | Status: DC | PRN

## 2017-11-17 NOTE — Patient Instructions (Addendum)
Can use Dulcolax suppository or MiraLAX for constipation.  Do not use stimulant laxatives such as Ex-Lax and senna. Colonoscopy to be scheduled. Keep ibuprofen use to minimum. Increase intake of fiber rich foods as discussed.

## 2017-11-17 NOTE — Progress Notes (Signed)
Reason for consultation;  Recent bout of diverticulitis  History of present illness.:  Pamela Pham is 52 year old Caucasian female who was referred through courtesy of Dr. Blake Divine for GI evaluation and possible colonoscopy.   Patient was admitted to [facility] on 09/06/2017 with 3-day history of lower abdominal pain.  She was diagnosed with sigmoid diverticulitis and she was also felt to have a small phlegmon.  She responded to IV antibiotics and she was discharged on 09/08/2017.  She was maintained on metronidazole and Flagyl for another 10 days.  During that admission patient was seen by Dr. Constance Haw who felt there was no indication for surgery but she recommended colonoscopy at a later date when patient has recovered from acute illness. Patient feels much better.  She denies abdominal pain.  She complains of irregular bowel movements.  She either has diarrhea and/or constipation.  This has been her pattern for several years.  She also has urgency when she has diarrhea and has accidents.  She denies melena or rectal bleeding.  She has good appetite.  She dropped her appetite during acute illness and lost 5 pounds but she states she has gained it all back.  She states she is having a lot more diarrhea than constipation. She has a history of migraine and has been using ibuprofen frequently but not since recent hospitalization.   Current Medications: Outpatient Encounter Medications as of 11/17/2017  Medication Sig  . Albuterol Sulfate (PROAIR HFA IN) Inhale 2 puffs into the lungs as needed (wheezing or shortness of breath).   Marland Kitchen atorvastatin (LIPITOR) 40 MG tablet Take 40 mg by mouth daily.  . clonazePAM (KLONOPIN) 0.5 MG tablet Take 0.5 mg by mouth daily.   Marland Kitchen levothyroxine (SYNTHROID, LEVOTHROID) 112 MCG tablet Take 112 mcg by mouth daily before breakfast.  . mirtazapine (REMERON) 45 MG tablet Take 45 mg by mouth at bedtime.   . ciprofloxacin (CIPRO) 500 MG tablet Take 1 tablet (500 mg total) by mouth 2  (two) times daily. (Patient not taking: Reported on 09/22/2017)  . metroNIDAZOLE (FLAGYL) 500 MG tablet Take 1 tablet (500 mg total) by mouth 3 (three) times daily. (Patient not taking: Reported on 09/22/2017)  . [DISCONTINUED] levothyroxine (SYNTHROID, LEVOTHROID) 100 MCG tablet 2 (two) times a week.   . [DISCONTINUED] traMADol (ULTRAM) 50 MG tablet Take 1 tablet (50 mg total) by mouth every 6 (six) hours as needed for moderate pain. (Patient not taking: Reported on 11/17/2017)   No facility-administered encounter medications on file as of 11/17/2017.    Past medical history: History of IV drug use.  Last use was in March 2009. Hypothyroidism diagnosed in 2002. Bronchial asthma. History of migraine. Anxiety and depression. Hearing impairment. Hyperlipidemia.  R ecent history of sigmoid diverticulitis as above. Cholecystectomy about 30 years ago. Stenting to distal aorta at age 33 secondary to stenosis.  Patient reports she was on birth control pills and was smoking. C-section in 2003. Foreign body removal from right arm in October 2014.   Robotic assisted hysterectomy with bilateral salpingectomy in August 2015   Allergies: Allergies  Allergen Reactions  . Codeine   . Sulfa Antibiotics Nausea And Vomiting  . Sulfa Antibiotics Nausea Only    Family history: Father died of lung carcinoma age 60. Mother is 54 years old.  She has coronary artery disease and rheumatoid arthritis. She has one half sister in good health.  She also has one half brother but she has no contact with them. Family history significant for diverticulitis in  first cousin and paternal uncle and a paternal aunt had to have surgery.   Social history:  She is divorced.  She has 1 son age 32 in good health. She works in [employer].  She has been smoking since she was 52 years old.  She is smoking half of cigarettes per day.  She drinks alcohol occasionally.   Physical examination: Blood pressure 118/70, pulse 65,  temperature 97.6 F (36.4 C), temperature source Oral, resp. rate 18, height 5\' 2"  (1.575 m), weight 138 lb 9.6 oz (62.9 kg), last menstrual period 12/02/2013. Patient is alert and in no acute distress. Conjunctiva is pink. Sclera is nonicteric Oropharyngeal mucosa is normal. No neck masses or thyromegaly noted. Cardiac exam with regular rhythm normal S1 and S2. No murmur or gallop noted. Lungs are clear to auscultation. Abdomen is symmetrical.  Bowel sounds are normal.  No bruit noted.  On palpation abdomen is soft.  Mild tenderness noted on deep palpation at LLQ.  No organomegaly or masses. No LE edema or clubbing noted. She has multiple tattoos including one over her upper back.   Labs/studies Results:  Abdominal pelvic CT from 09/06/2017 reviewed. Thickening to sigmoid colon wall and surrounding inflammatory changes along with 2.8 cm low-density focal abnormality felt to be phlegmon.  minimal pelvic ascites.  There appears to be very short stent in the distal aorta.   Assessment:  #1. Patient is 52 year old Caucasian female who was hospitalized over 2 months ago with sigmoid diverticulitis.  She responded to antibiotics and is now pain-free.  She has never been screened for CRC.  Focal abnormality adjacent to sigmoid colon did not appear to be an abscess.  She needs to undergo colonoscopy to rule out neoplasm masquerading as diverticulitis.  I will review CT with Dr. Thornton Papas.  I do not feel there is indication for repeat CT at this time.  #2. Irritable bowel syndrome . She has irregular bowel movements she either has diarrhea and/or constipation.. Since she is having more diarrhea and constipation she may benefit from as needed antispasmodic.   Recommendations:  Patient advised to use ibuprofen use to minimum. Increase intake of fiber rich foods as discussed.  Dicyclomine 10 mg p.o. 3 times daily as needed. Can use Dulcolax suppository for constipation but do not use stimulant  laxatives such as Ex-Lax or senna.   Diagnostic colonoscopy to be scheduled under monitored anesthesia care.   Patient also advised to call office if she has symptoms like she had back in May 2019. Office visit in 6 months.

## 2017-11-18 ENCOUNTER — Other Ambulatory Visit (INDEPENDENT_AMBULATORY_CARE_PROVIDER_SITE_OTHER): Payer: Self-pay | Admitting: *Deleted

## 2017-11-18 ENCOUNTER — Encounter (INDEPENDENT_AMBULATORY_CARE_PROVIDER_SITE_OTHER): Payer: Self-pay | Admitting: *Deleted

## 2017-11-18 ENCOUNTER — Telehealth (INDEPENDENT_AMBULATORY_CARE_PROVIDER_SITE_OTHER): Payer: Self-pay | Admitting: *Deleted

## 2017-11-18 DIAGNOSIS — K5732 Diverticulitis of large intestine without perforation or abscess without bleeding: Secondary | ICD-10-CM | POA: Insufficient documentation

## 2017-11-18 DIAGNOSIS — R935 Abnormal findings on diagnostic imaging of other abdominal regions, including retroperitoneum: Secondary | ICD-10-CM | POA: Insufficient documentation

## 2017-11-18 MED ORDER — PEG 3350-KCL-NA BICARB-NACL 420 G PO SOLR: 4000.0000 mL | Freq: Once | ORAL | 0 refills | Status: AC

## 2017-11-18 NOTE — Telephone Encounter (Signed)
Patient needs trilyte 

## 2017-11-26 NOTE — Patient Instructions (Signed)
Pamela Pham  11/26/2017     @PREFPERIOPPHARMACY @   Your procedure is scheduled on  12/04/2017 .  Report to [facility] at  1100   A.M.  Call this number if you have problems the morning of surgery:  [phone]   Remember:  Do not eat or drink after midnight.  You may drink clear liquids until (follow the instructions given to you).  Clear liquids allowed are:                    Water, Juice (non-citric and without pulp), Carbonated beverages, Clear Tea, Black Coffee only, Plain Jell-O only, Gatorade and Plain Popsicles only    Take these medicines the morning of surgery with A SIP OF WATER  Bentyl, levothyroxine. Use your inhaler before you come.    Do not wear jewelry, make-up or nail polish.  Do not wear lotions, powders, or perfumes, or deodorant.  Do not shave 48 hours prior to surgery.  Men may shave face and neck.  Do not bring valuables to the hospital.  [facility] is not responsible for any belongings or valuables.  Contacts, dentures or bridgework may not be worn into surgery.  Leave your suitcase in the car.  After surgery it may be brought to your room.  For patients admitted to the hospital, discharge time will be determined by your treatment team.  Patients discharged the day of surgery will not be allowed to drive home.   Name and phone number of your driver:   family Special instructions:  Follow the diet and prep instructions given to you by Dr Olevia Perches office.  Please read over the following fact sheets that you were given. Anesthesia Post-op Instructions and Care and Recovery After Surgery       Colonoscopy, Adult A colonoscopy is an exam to look at the large intestine. It is done to check for problems, such as:  Lumps (tumors).  Growths (polyps).  Swelling (inflammation).  Bleeding.  What happens before the procedure? Eating and drinking Follow instructions from your doctor about eating and drinking. These instructions may  include:  A few days before the procedure - follow a low-fiber diet. ? Avoid nuts. ? Avoid seeds. ? Avoid dried fruit. ? Avoid raw fruits. ? Avoid vegetables.  1-3 days before the procedure - follow a clear liquid diet. Avoid liquids that have red or purple dye. Drink only clear liquids, such as: ? Clear broth or bouillon. ? Black coffee or tea. ? Clear juice. ? Clear soft drinks or sports drinks. ? Gelatin dessert. ? Popsicles.  On the day of the procedure - do not eat or drink anything during the 2 hours before the procedure.  Bowel prep If you were prescribed an oral bowel prep:  Take it as told by your doctor. Starting the day before your procedure, you will need to drink a lot of liquid. The liquid will cause you to poop (have bowel movements) until your poop is almost clear or light green.  If your skin or butt gets irritated from diarrhea, you may: ? Wipe the area with wipes that have medicine in them, such as adult wet wipes with aloe and vitamin E. ? Put something on your skin that soothes the area, such as petroleum jelly.  If you throw up (vomit) while drinking the bowel prep, take a break for up to 60 minutes. Then begin the bowel prep again. If you  keep throwing up and you cannot take the bowel prep without throwing up, call your doctor.  General instructions  Ask your doctor about changing or stopping your normal medicines. This is important if you take diabetes medicines or blood thinners.  Plan to have someone take you home from the hospital or clinic. What happens during the procedure?  An IV tube may be put into one of your veins.  You will be given medicine to help you relax (sedative).  To reduce your risk of infection: ? Your doctors will wash their hands. ? Your anal area will be washed with soap.  You will be asked to lie on your side with your knees bent.  Your doctor will get a long, thin, flexible tube ready. The tube will have a camera and a  light on the end.  The tube will be put into your anus.  The tube will be gently put into your large intestine.  Air will be delivered into your large intestine to keep it open. You may feel some pressure or cramping.  The camera will be used to take photos.  A small tissue sample may be removed from your body to be looked at under a microscope (biopsy). If any possible problems are found, the tissue will be sent to a lab for testing.  If small growths are found, your doctor may remove them and have them checked for cancer.  The tube that was put into your anus will be slowly removed. The procedure may vary among doctors and hospitals. What happens after the procedure?  Your doctor will check on you often until the medicines you were given have worn off.  Do not drive for 24 hours after the procedure.  You may have a small amount of blood in your poop.  You may pass gas.  You may have mild cramps or bloating in your belly (abdomen).  It is up to you to get the results of your procedure. Ask your doctor, or the department performing the procedure, when your results will be ready. This information is not intended to replace advice given to you by your health care provider. Make sure you discuss any questions you have with your health care provider. Document Released: 05/17/2010 Document Revised: 02/13/2016 Document Reviewed: 06/26/2015 Elsevier Interactive Patient Education  2017 [employer]  Colonoscopy, Adult, Care After This sheet gives you information about how to care for yourself after your procedure. Your health care provider may also give you more specific instructions. If you have problems or questions, contact your health care provider. What can I expect after the procedure? After the procedure, it is common to have:  A small amount of blood in your stool for 24 hours after the procedure.  Some gas.  Mild abdominal cramping or bloating.  Follow these  instructions at home: General instructions   For the first 24 hours after the procedure: ? Do not drive or use machinery. ? Do not sign important documents. ? Do not drink alcohol. ? Do your regular daily activities at a slower pace than normal. ? Eat soft, easy-to-digest foods. ? Rest often.  Take over-the-counter or prescription medicines only as told by your health care provider.  It is up to you to get the results of your procedure. Ask your health care provider, or the department performing the procedure, when your results will be ready. Relieving cramping and bloating  Try walking around when you have cramps or feel bloated.  Apply heat to  your abdomen as told by your health care provider. Use a heat source that your health care provider recommends, such as a moist heat pack or a heating pad. ? Place a towel between your skin and the heat source. ? Leave the heat on for 20-30 minutes. ? Remove the heat if your skin turns bright red. This is especially important if you are unable to feel pain, heat, or cold. You may have a greater risk of getting burned. Eating and drinking  Drink enough fluid to keep your urine clear or pale yellow.  Resume your normal diet as instructed by your health care provider. Avoid heavy or fried foods that are hard to digest.  Avoid drinking alcohol for as long as instructed by your health care provider. Contact a health care provider if:  You have blood in your stool 2-3 days after the procedure. Get help right away if:  You have more than a small spotting of blood in your stool.  You pass large blood clots in your stool.  Your abdomen is swollen.  You have nausea or vomiting.  You have a fever.  You have increasing abdominal pain that is not relieved with medicine. This information is not intended to replace advice given to you by your health care provider. Make sure you discuss any questions you have with your health care  provider. Document Released: 11/27/2003 Document Revised: 01/07/2016 Document Reviewed: 06/26/2015 Elsevier Interactive Patient Education  2018 [place] Anesthesia is a term that refers to techniques, procedures, and medicines that help a person stay safe and comfortable during a medical procedure. Monitored anesthesia care, or sedation, is one type of anesthesia. Your anesthesia specialist may recommend sedation if you will be having a procedure that does not require you to be unconscious, such as:  Cataract surgery.  A dental procedure.  A biopsy.  A colonoscopy.  During the procedure, you may receive a medicine to help you relax (sedative). There are three levels of sedation:  Mild sedation. At this level, you may feel awake and relaxed. You will be able to follow directions.  Moderate sedation. At this level, you will be sleepy. You may not remember the procedure.  Deep sedation. At this level, you will be asleep. You will not remember the procedure.  The more medicine you are given, the deeper your level of sedation will be. Depending on how you respond to the procedure, the anesthesia specialist may change your level of sedation or the type of anesthesia to fit your needs. An anesthesia specialist will monitor you closely during the procedure. Let your health care provider know about:  Any allergies you have.  All medicines you are taking, including vitamins, herbs, eye drops, creams, and over-the-counter medicines.  Any use of steroids (by mouth or as a cream).  Any problems you or family members have had with sedatives and anesthetic medicines.  Any blood disorders you have.  Any surgeries you have had.  Any medical conditions you have, such as sleep apnea.  Whether you are pregnant or may be pregnant.  Any use of cigarettes, alcohol, or street drugs. What are the risks? Generally, this is a safe procedure. However, problems may  occur, including:  Getting too much medicine (oversedation).  Nausea.  Allergic reaction to medicines.  Trouble breathing. If this happens, a breathing tube may be used to help with breathing. It will be removed when you are awake and breathing on your own.  Heart trouble.  Lung trouble.  Before the procedure Staying hydrated Follow instructions from your health care provider about hydration, which may include:  Up to 2 hours before the procedure - you may continue to drink clear liquids, such as water, clear fruit juice, black coffee, and plain tea.  Eating and drinking restrictions Follow instructions from your health care provider about eating and drinking, which may include:  8 hours before the procedure - stop eating heavy meals or foods such as meat, fried foods, or fatty foods.  6 hours before the procedure - stop eating light meals or foods, such as toast or cereal.  6 hours before the procedure - stop drinking milk or drinks that contain milk.  2 hours before the procedure - stop drinking clear liquids.  Medicines Ask your health care provider about:  Changing or stopping your regular medicines. This is especially important if you are taking diabetes medicines or blood thinners.  Taking medicines such as aspirin and ibuprofen. These medicines can thin your blood. Do not take these medicines before your procedure if your health care provider instructs you not to.  Tests and exams  You will have a physical exam.  You may have blood tests done to show: ? How well your kidneys and liver are working. ? How well your blood can clot.  General instructions  Plan to have someone take you home from the hospital or clinic.  If you will be going home right after the procedure, plan to have someone with you for 24 hours.  What happens during the procedure?  Your blood pressure, heart rate, breathing, level of pain and overall condition will be monitored.  An IV  tube will be inserted into one of your veins.  Your anesthesia specialist will give you medicines as needed to keep you comfortable during the procedure. This may mean changing the level of sedation.  The procedure will be performed. After the procedure  Your blood pressure, heart rate, breathing rate, and blood oxygen level will be monitored until the medicines you were given have worn off.  Do not drive for 24 hours if you received a sedative.  You may: ? Feel sleepy, clumsy, or nauseous. ? Feel forgetful about what happened after the procedure. ? Have a sore throat if you had a breathing tube during the procedure. ? Vomit. This information is not intended to replace advice given to you by your health care provider. Make sure you discuss any questions you have with your health care provider. Document Released: 01/08/2005 Document Revised: 09/21/2015 Document Reviewed: 08/05/2015 Elsevier Interactive Patient Education  2018 [place], Care After These instructions provide you with information about caring for yourself after your procedure. Your health care provider may also give you more specific instructions. Your treatment has been planned according to current medical practices, but problems sometimes occur. Call your health care provider if you have any problems or questions after your procedure. What can I expect after the procedure? After your procedure, it is common to:  Feel sleepy for several hours.  Feel clumsy and have poor balance for several hours.  Feel forgetful about what happened after the procedure.  Have poor judgment for several hours.  Feel nauseous or vomit.  Have a sore throat if you had a breathing tube during the procedure.  Follow these instructions at home: For at least 24 hours after the procedure:   Do not: ? Participate in activities in which you could fall or become injured. ?  Drive. ? Use heavy  machinery. ? Drink alcohol. ? Take sleeping pills or medicines that cause drowsiness. ? Make important decisions or sign legal documents. ? Take care of children on your own.  Rest. Eating and drinking  Follow the diet that is recommended by your health care provider.  If you vomit, drink water, juice, or soup when you can drink without vomiting.  Make sure you have little or no nausea before eating solid foods. General instructions  Have a responsible adult stay with you until you are awake and alert.  Take over-the-counter and prescription medicines only as told by your health care provider.  If you smoke, do not smoke without supervision.  Keep all follow-up visits as told by your health care provider. This is important. Contact a health care provider if:  You keep feeling nauseous or you keep vomiting.  You feel light-headed.  You develop a rash.  You have a fever. Get help right away if:  You have trouble breathing. This information is not intended to replace advice given to you by your health care provider. Make sure you discuss any questions you have with your health care provider. Document Released: 08/05/2015 Document Revised: 12/05/2015 Document Reviewed: 08/05/2015 Elsevier Interactive Patient Education  [employer].

## 2017-12-01 ENCOUNTER — Encounter (HOSPITAL_COMMUNITY): Payer: Self-pay

## 2017-12-01 ENCOUNTER — Encounter (HOSPITAL_COMMUNITY)
Admission: RE | Admit: 2017-12-01 | Discharge: 2017-12-01 | Disposition: A | Discharge status: Home / Self Care | Payer: BLUE CROSS/BLUE SHIELD | Source: Ambulatory Visit | Attending: Internal Medicine | Admitting: Internal Medicine

## 2017-12-01 ENCOUNTER — Other Ambulatory Visit: Payer: Self-pay

## 2017-12-01 DIAGNOSIS — Z01812 Encounter for preprocedural laboratory examination: Secondary | ICD-10-CM | POA: Insufficient documentation

## 2017-12-01 LAB — BASIC METABOLIC PANEL
ANION GAP: 6 (ref 5–15)
BUN: 11 mg/dL (ref 6–20)
CALCIUM: 9.2 mg/dL (ref 8.9–10.3)
CO2: 27 mmol/L (ref 22–32)
Chloride: 108 mmol/L (ref 98–111)
Creatinine, Ser: 0.79 mg/dL (ref 0.44–1.00)
GFR calc Af Amer: >60 mL/min (ref >60)
GLUCOSE: 107 mg/dL — AB (ref 70–99)
Potassium: 3.7 mmol/L (ref 3.5–5.1)
Sodium: 141 mmol/L (ref 135–145)

## 2017-12-01 LAB — CBC
HCT: 42.2 % (ref 36.0–46.0)
Hemoglobin: 14.3 g/dL (ref 12.0–15.0)
MCH: 31.8 pg (ref 26.0–34.0)
MCHC: 33.9 g/dL (ref 30.0–36.0)
MCV: 93.8 fL (ref 78.0–100.0)
PLATELETS: 217 10*3/uL (ref 150–400)
RBC: 4.5 MIL/uL (ref 3.87–5.11)
RDW: 16.1 % — AB (ref 11.5–15.5)
WBC: 7.5 10*3/uL (ref 4.0–10.5)

## 2017-12-01 LAB — RAPID URINE DRUG SCREEN, HOSP PERFORMED
Amphetamines: NOT DETECTED
BARBITURATES: NOT DETECTED
Benzodiazepines: POSITIVE — AB
COCAINE: NOT DETECTED
Opiates: NOT DETECTED
TETRAHYDROCANNABINOL: POSITIVE — AB

## 2017-12-04 ENCOUNTER — Ambulatory Visit (HOSPITAL_COMMUNITY): Payer: BLUE CROSS/BLUE SHIELD | Admitting: Anesthesiology

## 2017-12-04 ENCOUNTER — Other Ambulatory Visit: Payer: Self-pay

## 2017-12-04 ENCOUNTER — Ambulatory Visit (HOSPITAL_COMMUNITY)
Admission: RE | Admit: 2017-12-04 | Discharge: 2017-12-04 | Disposition: A | Discharge status: Home / Self Care | Payer: BLUE CROSS/BLUE SHIELD | Source: Ambulatory Visit | Attending: Internal Medicine | Admitting: Internal Medicine

## 2017-12-04 ENCOUNTER — Encounter (HOSPITAL_COMMUNITY)
Admission: RE | Disposition: A | Discharge status: Home / Self Care | Payer: Self-pay | Source: Ambulatory Visit | Attending: Internal Medicine

## 2017-12-04 ENCOUNTER — Encounter (HOSPITAL_COMMUNITY): Payer: Self-pay | Admitting: *Deleted

## 2017-12-04 DIAGNOSIS — D124 Benign neoplasm of descending colon: Secondary | ICD-10-CM | POA: Insufficient documentation

## 2017-12-04 DIAGNOSIS — K644 Residual hemorrhoidal skin tags: Secondary | ICD-10-CM | POA: Insufficient documentation

## 2017-12-04 DIAGNOSIS — Z09 Encounter for follow-up examination after completed treatment for conditions other than malignant neoplasm: Secondary | ICD-10-CM | POA: Insufficient documentation

## 2017-12-04 DIAGNOSIS — Z79899 Other long term (current) drug therapy: Secondary | ICD-10-CM | POA: Diagnosis not present

## 2017-12-04 DIAGNOSIS — Z8719 Personal history of other diseases of the digestive system: Secondary | ICD-10-CM | POA: Diagnosis not present

## 2017-12-04 DIAGNOSIS — D125 Benign neoplasm of sigmoid colon: Secondary | ICD-10-CM | POA: Diagnosis not present

## 2017-12-04 DIAGNOSIS — J45909 Unspecified asthma, uncomplicated: Secondary | ICD-10-CM | POA: Insufficient documentation

## 2017-12-04 DIAGNOSIS — F17210 Nicotine dependence, cigarettes, uncomplicated: Secondary | ICD-10-CM | POA: Diagnosis not present

## 2017-12-04 DIAGNOSIS — K5732 Diverticulitis of large intestine without perforation or abscess without bleeding: Secondary | ICD-10-CM | POA: Diagnosis not present

## 2017-12-04 DIAGNOSIS — Z79890 Hormone replacement therapy (postmenopausal): Secondary | ICD-10-CM | POA: Insufficient documentation

## 2017-12-04 DIAGNOSIS — E039 Hypothyroidism, unspecified: Secondary | ICD-10-CM | POA: Diagnosis not present

## 2017-12-04 DIAGNOSIS — K589 Irritable bowel syndrome without diarrhea: Secondary | ICD-10-CM | POA: Insufficient documentation

## 2017-12-04 DIAGNOSIS — R935 Abnormal findings on diagnostic imaging of other abdominal regions, including retroperitoneum: Secondary | ICD-10-CM

## 2017-12-04 DIAGNOSIS — F329 Major depressive disorder, single episode, unspecified: Secondary | ICD-10-CM | POA: Diagnosis not present

## 2017-12-04 DIAGNOSIS — K5730 Diverticulosis of large intestine without perforation or abscess without bleeding: Secondary | ICD-10-CM | POA: Insufficient documentation

## 2017-12-04 DIAGNOSIS — F419 Anxiety disorder, unspecified: Secondary | ICD-10-CM | POA: Insufficient documentation

## 2017-12-04 HISTORY — PX: COLONOSCOPY WITH PROPOFOL: SHX5780

## 2017-12-04 HISTORY — PX: POLYPECTOMY: SHX5525

## 2017-12-04 SURGERY — COLONOSCOPY WITH PROPOFOL
Anesthesia: General

## 2017-12-04 MED ORDER — PROPOFOL 10 MG/ML IV BOLUS
INTRAVENOUS | Status: DC | PRN
  Administered 2017-12-04 (×3): 30 mg via INTRAVENOUS

## 2017-12-04 MED ORDER — CHLORHEXIDINE GLUCONATE CLOTH 2 % EX PADS: 6.0000 | MEDICATED_PAD | Freq: Once | CUTANEOUS | Status: DC

## 2017-12-04 MED ORDER — LACTATED RINGERS IV SOLN
INTRAVENOUS | Status: DC
  Administered 2017-12-04: 11:00:00 via INTRAVENOUS

## 2017-12-04 MED ORDER — FENTANYL CITRATE (PF) 100 MCG/2ML IJ SOLN: 25.0000 ug | INTRAMUSCULAR | Status: DC | PRN

## 2017-12-04 MED ORDER — PROPOFOL 500 MG/50ML IV EMUL
INTRAVENOUS | Status: DC | PRN
  Administered 2017-12-04: 150 ug/kg/min via INTRAVENOUS

## 2017-12-04 NOTE — Discharge Instructions (Signed)
No aspirin or NSAIDs for 1 week. Resume usual medications as before. High-fiber diet. No driving for 24 hours. Physician will call with biopsy results.       Colonoscopy, Adult, Care After This sheet gives you information about how to care for yourself after your procedure. Your doctor may also give you more specific instructions. If you have problems or questions, call your doctor. Follow these instructions at home: General instructions   For the first 24 hours after the procedure: ? Do not drive or use machinery. ? Do not sign important documents. ? Do not drink alcohol. ? Do your daily activities more slowly than normal. ? Eat foods that are soft and easy to digest. ? Rest often.  Take over-the-counter or prescription medicines only as told by your doctor.  It is up to you to get the results of your procedure. Ask your doctor, or the department performing the procedure, when your results will be ready. To help cramping and bloating:  Try walking around.  Put heat on your belly (abdomen) as told by your doctor. Use a heat source that your doctor recommends, such as a moist heat pack or a heating pad. ? Put a towel between your skin and the heat source. ? Leave the heat on for 20-30 minutes. ? Remove the heat if your skin turns bright red. This is especially important if you cannot feel pain, heat, or cold. You can get burned. Eating and drinking  Drink enough fluid to keep your pee (urine) clear or pale yellow.  Return to your normal diet as told by your doctor. Avoid heavy or fried foods that are hard to digest.  Avoid drinking alcohol for as long as told by your doctor. Contact a doctor if:  You have blood in your poop (stool) 2-3 days after the procedure. Get help right away if:  You have more than a small amount of blood in your poop.  You see large clumps of tissue (blood clots) in your poop.  Your belly is swollen.  You feel sick to your stomach  (nauseous).  You throw up (vomit).  You have a fever.  You have belly pain that gets worse, and medicine does not help your pain. This information is not intended to replace advice given to you by your health care provider. Make sure you discuss any questions you have with your health care provider. Document Released: 05/17/2010 Document Revised: 01/07/2016 Document Reviewed: 01/07/2016 Elsevier Interactive Patient Education  2017 [place].     Colon Polyps Polyps are tissue growths inside the body. Polyps can grow in many places, including the large intestine (colon). A polyp may be a round bump or a mushroom-shaped growth. You could have one polyp or several. Most colon polyps are noncancerous (benign). However, some colon polyps can become cancerous over time. What are the causes? The exact cause of colon polyps is not known. What increases the risk? This condition is more likely to develop in people who:  Have a family history of colon cancer or colon polyps.  Are older than 40 or older than 45 if they are African American.  Have inflammatory bowel disease, such as ulcerative colitis or Crohn disease.  Are overweight.  Smoke cigarettes.  Do not get enough exercise.  Drink too much alcohol.  Eat a diet that is: ? High in fat and red meat. ? Low in fiber.  Had childhood cancer that was treated with abdominal radiation.  What are the signs or  symptoms? Most polyps do not cause symptoms. If you have symptoms, they may include:  Blood coming from your rectum when having a bowel movement.  Blood in your stool.The stool may look dark red or black.  A change in bowel habits, such as constipation or diarrhea.  How is this diagnosed? This condition is diagnosed with a colonoscopy. This is a procedure that uses a lighted, flexible scope to look at the inside of your colon. How is this treated? Treatment for this condition involves removing any polyps that are  found. Those polyps will then be tested for cancer. If cancer is found, your health care provider will talk to you about options for colon cancer treatment. Follow these instructions at home: Diet  Eat plenty of fiber, such as fruits, vegetables, and whole grains.  Eat foods that are high in calcium and vitamin D, such as milk, cheese, yogurt, eggs, liver, fish, and broccoli.  Limit foods high in fat, red meats, and processed meats, such as hot dogs, sausage, bacon, and lunch meats.  Maintain a healthy weight, or lose weight if recommended by your health care provider. General instructions  Do not smoke cigarettes.  Do not drink alcohol excessively.  Keep all follow-up visits as told by your health care provider. This is important. This includes keeping regularly scheduled colonoscopies. Talk to your health care provider about when you need a colonoscopy.  Exercise every day or as told by your health care provider. Contact a health care provider if:  You have new or worsening bleeding during a bowel movement.  You have new or increased blood in your stool.  You have a change in bowel habits.  You unexpectedly lose weight. This information is not intended to replace advice given to you by your health care provider. Make sure you discuss any questions you have with your health care provider. Document Released: 01/09/2004 Document Revised: 09/20/2015 Document Reviewed: 03/05/2015 Elsevier Interactive Patient Education  2018 [place].     High-Fiber Diet Fiber, also called dietary fiber, is a type of carbohydrate found in fruits, vegetables, whole grains, and beans. A high-fiber diet can have many health benefits. Your health care provider may recommend a high-fiber diet to help:  Prevent constipation. Fiber can make your bowel movements more regular.  Lower your cholesterol.  Relieve hemorrhoids, uncomplicated diverticulosis, or irritable bowel syndrome.  Prevent  overeating as part of a weight-loss plan.  Prevent heart disease, type 2 diabetes, and certain cancers.  What is my plan? The recommended daily intake of fiber includes:  38 grams for men under age 30.  33 grams for men over age 62.  79 grams for women under age 52.  3 grams for women over age 14.  You can get the recommended daily intake of dietary fiber by eating a variety of fruits, vegetables, grains, and beans. Your health care provider may also recommend a fiber supplement if it is not possible to get enough fiber through your diet. What do I need to know about a high-fiber diet?  Fiber supplements have not been widely studied for their effectiveness, so it is better to get fiber through food sources.  Always check the fiber content on thenutrition facts label of any prepackaged food. Look for foods that contain at least 5 grams of fiber per serving.  Ask your dietitian if you have questions about specific foods that are related to your condition, especially if those foods are not listed in the following section.  Increase  your daily fiber consumption gradually. Increasing your intake of dietary fiber too quickly may cause bloating, cramping, or gas.  Drink plenty of water. Water helps you to digest fiber. What foods can I eat? Grains Whole-grain breads. Multigrain cereal. Oats and oatmeal. Brown rice. Barley. Bulgur wheat. [place]. Bran muffins. Popcorn. Rye wafer crackers. Vegetables Sweet potatoes. Spinach. Kale. Artichokes. Cabbage. Broccoli. Green peas. Carrots. Squash. Fruits Berries. Pears. Apples. Oranges. Avocados. Prunes and raisins. Dried figs. Meats and Other Protein Sources [place], kidney, pinto, and soy beans. Split peas. Lentils. Nuts and seeds. Dairy Fiber-fortified yogurt. Beverages Fiber-fortified soy milk. Fiber-fortified orange juice. Other Fiber bars. The items listed above may not be a complete list of recommended foods or beverages. Contact your  dietitian for more options. What foods are not recommended? Grains White bread. Pasta made with refined flour. White rice. Vegetables Fried potatoes. Canned vegetables. Well-cooked vegetables. Fruits Fruit juice. Cooked, strained fruit. Meats and Other Protein Sources Fatty cuts of meat. Fried Sales executive or fried fish. Dairy Milk. Yogurt. Cream cheese. Sour cream. Beverages Soft drinks. Other Cakes and pastries. Butter and oils. The items listed above may not be a complete list of foods and beverages to avoid. Contact your dietitian for more information. What are some tips for including high-fiber foods in my diet?  Eat a wide variety of high-fiber foods.  Make sure that half of all grains consumed each day are whole grains.  Replace breads and cereals made from refined flour or white flour with whole-grain breads and cereals.  Replace white rice with brown rice, bulgur wheat, or millet.  Start the day with a breakfast that is high in fiber, such as a cereal that contains at least 5 grams of fiber per serving.  Use beans in place of meat in soups, salads, or pasta.  Eat high-fiber snacks, such as berries, raw vegetables, nuts, or popcorn. This information is not intended to replace advice given to you by your health care provider. Make sure you discuss any questions you have with your health care provider. Document Released: 04/14/2005 Document Revised: 09/20/2015 Document Reviewed: 09/27/2013 Elsevier Interactive Patient Education  2018 [place] POST-ANESTHESIA  IMMEDIATELY FOLLOWING SURGERY:  Do not drive or operate machinery for the first twenty four hours after surgery.  Do not make any important decisions for twenty four hours after surgery or while taking narcotic pain medications or sedatives.  If you develop intractable nausea and vomiting or a severe headache please notify your doctor immediately.  FOLLOW-UP:  Please make an appointment  with your surgeon as instructed. You do not need to follow up with anesthesia unless specifically instructed to do so.  WOUND CARE INSTRUCTIONS (if applicable):  Keep a dry clean dressing on the anesthesia/puncture wound site if there is drainage.  Once the wound has quit draining you may leave it open to air.  Generally you should leave the bandage intact for twenty four hours unless there is drainage.  If the epidural site drains for more than 36-48 hours please call the anesthesia department.  QUESTIONS?:  Please feel free to call your physician or the hospital operator if you have any questions, and they will be happy to assist you.

## 2017-12-04 NOTE — H&P (Signed)
Pamela Pham is an 52 y.o. female.   Chief Complaint: Patient is here for colonoscopy. HPI: Patient is 52 year old Caucasian female who is never been screened for CRC who was hospitalized over 2 months ago for sigmoid diverticulitis.  She has fully recovered from that infection.  She also has a history of IBS and doing much better since she has been on low-dose dicyclomine.  She is here for diagnostic colonoscopy.  She denies rectal bleeding.  Family history significant for diverticulitis in first cousin and paternal uncle and paternal aunt.  Her paternal aunt required surgery. Family history is negative for CRC.   Past Medical History:  Diagnosis Date  . Abnormal Pap smear of cervix   . Anxiety   . Asthma   . Depression   . Dysmenorrhea   . HOH (hard of hearing)    right ear  . Hypothyroidism   . Migraines   . Substance abuse (HCC)last exposure March, 2009.        Past Surgical History:  Procedure Laterality Date  . ABDOMINAL HYSTERECTOMY    . CERVICAL BIOPSY  W/ LOOP ELECTRODE EXCISION     in pts 20's  . CESAREAN SECTION  2003  . CHOLECYSTECTOMY     in late 20's  . CYSTOSCOPY N/A 12/19/2013   Procedure: CYSTOSCOPY;  Surgeon: Lyman Speller, MD;  Location: [facility];  Service: Gynecology;  Laterality: N/A;  . FOREIGN BODY REMOVAL Right 02/02/2013   Procedure: Exploration right forearm with removal foreign body;  Surgeon: Schuyler Amor, MD;  Location: [facility];  Service: Orthopedics;  Laterality: Right;  Foreign body given to patient.  Marland Kitchen ILIAC ARTERY STENT     age 44, congenital stricture of vessel  . ROBOTIC ASSISTED TOTAL HYSTERECTOMY N/A 12/19/2013   Procedure: ROBOTIC ASSISTED TOTAL HYSTERECTOMY with bilateral salpingectomy .;  Surgeon: Lyman Speller, MD;  Location: [facility];  Service: Gynecology;  Laterality: N/A;    Family History  Problem Relation Age of Onset  . Cancer Maternal Grandmother        leukemia  . Diabetes Maternal Grandmother    . Stroke Maternal Grandmother   . Heart attack Mother   . Hypertension Maternal Grandfather   . Thyroid disease Paternal Grandmother   . Cancer Paternal Uncle    Social History:  reports that she has been smoking cigarettes. She has been smoking about 0.50 packs per day. She has never used smokeless tobacco. She reports that she does not drink alcohol or use drugs.  Allergies:  Allergies  Allergen Reactions  . Codeine Nausea And Vomiting  . Sulfa Antibiotics Nausea And Vomiting  . Sulfa Antibiotics Nausea Only    Medications Prior to Admission  Medication Sig Dispense Refill  . albuterol (PROAIR HFA) 108 (90 Base) MCG/ACT inhaler Inhale 2 puffs into the lungs every 6 (six) hours as needed for wheezing or shortness of breath.     Marland Kitchen atorvastatin (LIPITOR) 40 MG tablet Take 40 mg by mouth at bedtime.     . clonazePAM (KLONOPIN) 0.5 MG tablet Take 0.5 mg by mouth every evening.     . dicyclomine (BENTYL) 10 MG capsule Take 1 capsule (10 mg total) by mouth 3 (three) times daily as needed for spasms. 60 capsule 2  . levothyroxine (SYNTHROID, LEVOTHROID) 112 MCG tablet Take 112 mcg by mouth daily before breakfast.    . mirtazapine (REMERON) 45 MG tablet Take 45 mg by mouth at bedtime.   0    No  results found for this or any previous visit (from the past 62 hour(s)). No results found.  ROS  Blood pressure 111/76, pulse (!) 56, temperature 98.1 F (36.7 C), temperature source Oral, resp. rate 18, height 5\' 2"  (1.575 m), weight 62.1 kg, last menstrual period 12/02/2013, SpO2 98 %. Physical Exam  Constitutional: She appears well-developed and well-nourished.  HENT:  Mouth/Throat: Oropharynx is clear and moist.  Eyes: Conjunctivae are normal. No scleral icterus.  Neck: No thyromegaly present.  Cardiovascular: Normal rate, regular rhythm and normal heart sounds.  No murmur heard. Respiratory: Effort normal and breath sounds normal.  GI:  Abdomen is symmetrical.  His Pfannenstiel  scar.  Small laparoscopy scar noted above umbilicus.  Abdomen is soft and nontender with organomegaly or masses.  Musculoskeletal: She exhibits no edema.  Lymphadenopathy:    She has no cervical adenopathy.  Neurological: She is alert.  Skin: Skin is warm and dry.     Assessment/Plan History of sigmoid diverticulitis. Diagnostic colonoscopy.  Hildred Laser, MD 12/04/2017, 11:26 AM

## 2017-12-04 NOTE — Anesthesia Postprocedure Evaluation (Signed)
Anesthesia Post Note  Patient: Pamela Pham  Procedure(s) Performed: COLONOSCOPY WITH PROPOFOL (N/A ) POLYPECTOMY  Patient location during evaluation: PACU Anesthesia Type: General Level of consciousness: awake and alert Pain management: pain level controlled Vital Signs Assessment: post-procedure vital signs reviewed and stable Respiratory status: spontaneous breathing Cardiovascular status: stable Postop Assessment: no apparent nausea or vomiting Anesthetic complications: no     Last Vitals:  Vitals:   12/04/17 1117  BP: 111/76  Pulse: (!) 56  Resp: 18  Temp: 36.7 C  SpO2: 98%    Last Pain:  Vitals:   12/04/17 1141  TempSrc:   PainSc: 0-No pain                 ADAMS, AMY A

## 2017-12-04 NOTE — Transfer of Care (Signed)
Immediate Anesthesia Transfer of Care Note  Patient: Pamela Pham  Procedure(s) Performed: COLONOSCOPY WITH PROPOFOL (N/A ) POLYPECTOMY  Patient Location: PACU  Anesthesia Type:MAC  Level of Consciousness: awake, alert , oriented and patient cooperative  Airway & Oxygen Therapy: Patient Spontanous Breathing  Post-op Assessment: Report given to RN and Post -op Vital signs reviewed and stable  Post vital signs: Reviewed and stable  Last Vitals:  Vitals Value Taken Time  BP    Temp    Pulse 60 12/04/2017 12:14 PM  Resp 11 12/04/2017 12:14 PM  SpO2 100 % 12/04/2017 12:14 PM  Vitals shown include unvalidated device data.  Last Pain:  Vitals:   12/04/17 1141  TempSrc:   PainSc: 0-No pain      Patients Stated Pain Goal: 6 (37/54/36 0677)  Complications: No apparent anesthesia complications

## 2017-12-04 NOTE — Anesthesia Procedure Notes (Signed)
Procedure Name: MAC Date/Time: 12/04/2017 11:38 AM Performed by: Andree Elk Rand Boller A, CRNA Pre-anesthesia Checklist: Patient identified, Emergency Drugs available, Suction available, Patient being monitored and Timeout performed Oxygen Delivery Method: Simple face mask

## 2017-12-04 NOTE — Addendum Note (Signed)
Addendum  created 12/04/17 1252 by Mickel Baas, CRNA   Charge Capture section accepted

## 2017-12-04 NOTE — Op Note (Signed)
[facility] Patient Name: Pamela Pham Procedure Date: 12/04/2017 11:36 AM MRN: [id number] Date of Birth: 10-11-1965 Attending MD: Hildred Laser , MD CSN: [id number] Age: 52 Admit Type: Outpatient Procedure:                Colonoscopy Indications:              Follow-up of diverticulitis Providers:                Hildred Laser, MD, Jeanann Lewandowsky. Sharon Seller, RN, Randa Spike, Technician Referring MD:             Curlene Labrum, MD and Judd Lien, MD Medicines:                Propofol per Anesthesia Complications:            No immediate complications. Estimated Blood Loss:     Estimated blood loss: none. Procedure:                Pre-Anesthesia Assessment:                           - Prior to the procedure, a History and Physical                            was performed, and patient medications and                            allergies were reviewed. The patient's tolerance of                            previous anesthesia was also reviewed. The risks                            and benefits of the procedure and the sedation                            options and risks were discussed with the patient.                            All questions were answered, and informed consent                            was obtained. Prior Anticoagulants: The patient has                            taken no previous anticoagulant or antiplatelet                            agents. ASA Grade Assessment: II - A patient with                            mild systemic disease. After reviewing the risks  and benefits, the patient was deemed in                            satisfactory condition to undergo the procedure.                           After obtaining informed consent, the colonoscope                            was passed under direct vision. Throughout the                            procedure, the patient's blood pressure, pulse, and                             oxygen saturations were monitored continuously. The                            [id number]-[id number] ([id number]) scope was introduced through                            the and advanced to the the terminal ileum, with                            identification of the appendiceal orifice and IC                            valve. The colonoscopy was performed without                            difficulty. The patient tolerated the procedure                            well. The quality of the bowel preparation was                            excellent. The terminal ileum, ileocecal valve,                            appendiceal orifice, and rectum were photographed. Scope In: 11:45:43 AM Scope Out: 12:06:20 PM Scope Withdrawal Time: 0 hours 15 minutes 53 seconds  Total Procedure Duration: 0 hours 20 minutes 37 seconds  Findings:      The perianal and digital rectal examinations were normal.      The terminal ileum appeared normal.      A single small-mouthed diverticulum was found in the transverse colon.      Multiple small-mouthed diverticula were found in the sigmoid colon.      A 6 mm polyp was found in the distal sigmoid colon. The polyp was       semi-pedunculated. The polyp was removed with a hot snare. Resection and       retrieval were complete.      External hemorrhoids were found during retroflexion. The hemorrhoids       were small. Impression:               -  The examined portion of the ileum was normal.                           - Diverticulosis in the transverse colon.                           - Diverticulosis in the sigmoid colon.                           - One 6 mm polyp in the distal sigmoid colon,                            removed with a hot snare. Resected and retrieved.                           - External hemorrhoids. Moderate Sedation:      Per Anesthesia Care Recommendation:           - Patient has a contact number available for                             emergencies. The signs and symptoms of potential                            delayed complications were discussed with the                            patient. Return to normal activities tomorrow.                            Written discharge instructions were provided to the                            patient.                           - High fiber diet today.                           - Continue present medications.                           - No aspirin, ibuprofen, naproxen, or other                            non-steroidal anti-inflammatory drugs for 7 days.                           - Await pathology results.                           - Repeat colonoscopy is recommended. The                            colonoscopy date will be determined after pathology  results from today's exam become available for                            review. Procedure Code(s):        --- Professional ---                           [phone], Colonoscopy, flexible; with removal of                            tumor(s), polyp(s), or other lesion(s) by snare                            technique Diagnosis Code(s):        --- Professional ---                           K64.4, Residual hemorrhoidal skin tags                           D12.5, Benign neoplasm of sigmoid colon                           K57.32, Diverticulitis of large intestine without                            perforation or abscess without bleeding                           K57.30, Diverticulosis of large intestine without                            perforation or abscess without bleeding CPT copyright 2017 American Medical Association. All rights reserved. The codes documented in this report are preliminary and upon coder review may  be revised to meet current compliance requirements. Hildred Laser, MD Hildred Laser, MD 12/04/2017 12:16:29 PM This report has been signed electronically. Number of Addenda: 0

## 2017-12-04 NOTE — Anesthesia Preprocedure Evaluation (Addendum)
Anesthesia Evaluation  Patient identified by MRN, date of birth, ID band Patient awake    Reviewed: Allergy & Precautions, NPO status , Patient's Chart, lab work & pertinent test results  Airway Mallampati: II  TM Distance: >3 FB Neck ROM: Full    Dental no notable dental hx. (+) Teeth Intact   Pulmonary neg pulmonary ROS, asthma , Current Smoker,  Smokes and uses Albuterol PRN -used today  Uses somewhat often    Pulmonary exam normal breath sounds clear to auscultation       Cardiovascular Exercise Tolerance: Good negative cardio ROS Normal cardiovascular examI Rhythm:Regular Rate:Normal     Neuro/Psych  Headaches, Anxiety Depression negative neurological ROS  negative psych ROS   GI/Hepatic negative GI ROS, Neg liver ROS,   Endo/Other  negative endocrine ROSHypothyroidism   Renal/GU negative Renal ROS  negative genitourinary   Musculoskeletal negative musculoskeletal ROS (+)   Abdominal   Peds negative pediatric ROS (+)  Hematology negative hematology ROS (+)   Anesthesia Other Findings H/o smoking cocaine in past - states clean x 10 years  Denies any IVDA in past   Reproductive/Obstetrics negative OB ROS                            Anesthesia Physical Anesthesia Plan  ASA: II  Anesthesia Plan: General   Post-op Pain Management:    Induction: Intravenous  PONV Risk Score and Plan:   Airway Management Planned: Simple Face Mask  Additional Equipment:   Intra-op Plan:   Post-operative Plan:   Informed Consent: I have reviewed the patients History and Physical, chart, labs and discussed the procedure including the risks, benefits and alternatives for the proposed anesthesia with the patient or authorized representative who has indicated his/her understanding and acceptance.   Dental advisory given  Plan Discussed with: CRNA  Anesthesia Plan Comments:          Anesthesia Quick Evaluation

## 2017-12-09 ENCOUNTER — Encounter (HOSPITAL_COMMUNITY): Payer: Self-pay | Admitting: Internal Medicine

## 2017-12-17 ENCOUNTER — Telehealth (INDEPENDENT_AMBULATORY_CARE_PROVIDER_SITE_OTHER): Payer: Self-pay | Admitting: Internal Medicine

## 2017-12-17 MED ORDER — METRONIDAZOLE 500 MG PO TABS: 500.0000 mg | ORAL_TABLET | Freq: Two times a day (BID) | ORAL | 0 refills | Status: DC

## 2017-12-17 MED ORDER — CIPROFLOXACIN HCL 500 MG PO TABS: 500.0000 mg | ORAL_TABLET | Freq: Two times a day (BID) | ORAL | 0 refills | Status: DC

## 2017-12-17 NOTE — Telephone Encounter (Signed)
Patient called stating her lower abdominal pain is back.  She is seeing pain that she had when she had diverticulitis but is not as bad.  She is not febrile or throwing up. Prescription for Cipro and metronidazole sent to patient's pharmacy. Patient advised to call us with progress report in 1 week or so or earlier if symptoms worsen.

## 2018-03-03 DIAGNOSIS — E039 Hypothyroidism, unspecified: Secondary | ICD-10-CM | POA: Diagnosis not present

## 2018-03-08 DIAGNOSIS — E039 Hypothyroidism, unspecified: Secondary | ICD-10-CM | POA: Diagnosis not present

## 2018-03-11 DIAGNOSIS — Z23 Encounter for immunization: Secondary | ICD-10-CM | POA: Diagnosis not present

## 2018-03-11 DIAGNOSIS — Z0001 Encounter for general adult medical examination with abnormal findings: Secondary | ICD-10-CM | POA: Diagnosis not present

## 2018-03-11 DIAGNOSIS — F17210 Nicotine dependence, cigarettes, uncomplicated: Secondary | ICD-10-CM | POA: Diagnosis not present

## 2018-03-11 DIAGNOSIS — F411 Generalized anxiety disorder: Secondary | ICD-10-CM | POA: Diagnosis not present

## 2018-03-11 DIAGNOSIS — F339 Major depressive disorder, recurrent, unspecified: Secondary | ICD-10-CM | POA: Diagnosis not present

## 2018-03-11 DIAGNOSIS — E039 Hypothyroidism, unspecified: Secondary | ICD-10-CM | POA: Diagnosis not present

## 2018-03-11 DIAGNOSIS — E782 Mixed hyperlipidemia: Secondary | ICD-10-CM | POA: Diagnosis not present

## 2018-05-25 ENCOUNTER — Ambulatory Visit (INDEPENDENT_AMBULATORY_CARE_PROVIDER_SITE_OTHER): Payer: BLUE CROSS/BLUE SHIELD | Admitting: Internal Medicine

## 2018-05-25 ENCOUNTER — Encounter (INDEPENDENT_AMBULATORY_CARE_PROVIDER_SITE_OTHER): Payer: Self-pay | Admitting: Internal Medicine

## 2018-08-10 ENCOUNTER — Other Ambulatory Visit: Payer: Self-pay

## 2018-08-10 ENCOUNTER — Encounter (INDEPENDENT_AMBULATORY_CARE_PROVIDER_SITE_OTHER): Payer: Self-pay | Admitting: Internal Medicine

## 2018-08-10 ENCOUNTER — Ambulatory Visit (INDEPENDENT_AMBULATORY_CARE_PROVIDER_SITE_OTHER): Payer: BLUE CROSS/BLUE SHIELD | Admitting: Internal Medicine

## 2018-08-10 DIAGNOSIS — K582 Mixed irritable bowel syndrome: Secondary | ICD-10-CM

## 2018-08-10 DIAGNOSIS — Z8719 Personal history of other diseases of the digestive system: Secondary | ICD-10-CM

## 2018-08-10 MED ORDER — DOCUSATE SODIUM 100 MG PO CAPS: 200.0000 mg | ORAL_CAPSULE | Freq: Every day | ORAL | 0 refills | Status: DC

## 2018-08-10 MED ORDER — BENEFIBER DRINK MIX PO PACK: 4.0000 g | PACK | Freq: Every day | ORAL | Status: DC

## 2018-08-10 NOTE — Patient Instructions (Signed)
Patient will call if dietary measures plus Colace plus Benefiber does not work.

## 2018-08-10 NOTE — Progress Notes (Addendum)
Virtual Visit via Video Note  I connected with Pamela Pham on 08/10/18 at  2:45 PM EDT by a video enabled telemedicine application and verified that I am speaking with the correct person using two identifiers.   I discussed the limitations of evaluation and management by telemedicine and the availability of in person appointments. The patient expressed understanding and agreed to proceed with telephone visit, she is unable to do video visit. Pamela Pham is at home and I am in the office. Thomas Hoff, LPN was also present for telephone visit.  Patient scheduled visit was postponed because of ongoing COVID-19 pandemic.  She therefore requested telephone visit and I agreed.  History of Present Illness:  Patient is 53 year old Caucasian female who was treated for diverticulitis last year.  She also has a history of IBS both diarrhea and constipation.  She had colonoscopy in August 2019 with removal of 6 mm tubular adenoma from sigmoid colon.  She had diverticula at transverse sigmoid colon as well as external hemorrhoids. Patient states she is doing well her appetite is good and her weight has been stable.  However she remains with irregular bowel movements.  She has more constipation and diarrhea.  She denies melena or rectal bleeding.  He uses dicyclomine no more than 3-4 times in a month when she has diarrhea or spasms.  She wants to know if she could try a stool softener if it would help with her constipation.  She does not take OTC NSAIDs.  Medication list reviewed.   Observations/Objective:  Patient appeared to be calm during telephone conversation.  Assessment and Plan:  #1.  History of diverticulitis.  She was hospitalized in May last year and has done well since then.  She should refrain from using NSAIDs.  #2. irritable bowel syndrome.  She is having more constipation than diarrhea.  She would therefore benefit from taking Benefiber and a stool softener.  Follow Up  Instructions:  Benefiber 4 g by mouth daily at bedtime. Colace 200 mg by mouth daily at bedtime. Patient will call office if above measures not helpful. Office visit on as-needed basis. Next surveillance colonoscopy would be in August 2024.    I discussed the assessment and treatment plan with the patient. The patient was provided an opportunity to ask questions and all were answered. The patient agreed with the plan and demonstrated an understanding of the instructions.   The patient was advised to call back or seek an in-person evaluation if the symptoms worsen or if the condition fails to improve as anticipated.  I provided 7 minutes of non-face-to-face time during this encounter.   Hildred Laser, MD

## 2018-08-18 DIAGNOSIS — D2262 Melanocytic nevi of left upper limb, including shoulder: Secondary | ICD-10-CM | POA: Diagnosis not present

## 2018-08-18 DIAGNOSIS — C44729 Squamous cell carcinoma of skin of left lower limb, including hip: Secondary | ICD-10-CM | POA: Diagnosis not present

## 2018-08-30 DIAGNOSIS — E782 Mixed hyperlipidemia: Secondary | ICD-10-CM | POA: Diagnosis not present

## 2018-08-30 DIAGNOSIS — E039 Hypothyroidism, unspecified: Secondary | ICD-10-CM | POA: Diagnosis not present

## 2018-08-31 DIAGNOSIS — E782 Mixed hyperlipidemia: Secondary | ICD-10-CM | POA: Diagnosis not present

## 2018-08-31 DIAGNOSIS — E039 Hypothyroidism, unspecified: Secondary | ICD-10-CM | POA: Diagnosis not present

## 2018-08-31 DIAGNOSIS — F339 Major depressive disorder, recurrent, unspecified: Secondary | ICD-10-CM | POA: Diagnosis not present

## 2018-08-31 DIAGNOSIS — F17210 Nicotine dependence, cigarettes, uncomplicated: Secondary | ICD-10-CM | POA: Diagnosis not present

## 2018-11-16 DIAGNOSIS — R05 Cough: Secondary | ICD-10-CM | POA: Diagnosis not present

## 2018-11-16 DIAGNOSIS — Z6825 Body mass index (BMI) 25.0-25.9, adult: Secondary | ICD-10-CM | POA: Diagnosis not present

## 2018-11-16 DIAGNOSIS — J029 Acute pharyngitis, unspecified: Secondary | ICD-10-CM | POA: Diagnosis not present

## 2018-12-01 ENCOUNTER — Encounter: Payer: Self-pay | Admitting: Obstetrics & Gynecology

## 2018-12-01 DIAGNOSIS — Z1231 Encounter for screening mammogram for malignant neoplasm of breast: Secondary | ICD-10-CM | POA: Diagnosis not present

## 2019-04-04 DIAGNOSIS — Z0001 Encounter for general adult medical examination with abnormal findings: Secondary | ICD-10-CM | POA: Diagnosis not present

## 2019-04-04 DIAGNOSIS — E782 Mixed hyperlipidemia: Secondary | ICD-10-CM | POA: Diagnosis not present

## 2019-04-04 DIAGNOSIS — F17210 Nicotine dependence, cigarettes, uncomplicated: Secondary | ICD-10-CM | POA: Diagnosis not present

## 2019-04-04 DIAGNOSIS — Z23 Encounter for immunization: Secondary | ICD-10-CM | POA: Diagnosis not present

## 2019-04-04 DIAGNOSIS — H9191 Unspecified hearing loss, right ear: Secondary | ICD-10-CM | POA: Diagnosis not present

## 2019-04-04 DIAGNOSIS — Z6826 Body mass index (BMI) 26.0-26.9, adult: Secondary | ICD-10-CM | POA: Diagnosis not present

## 2019-04-04 DIAGNOSIS — H9311 Tinnitus, right ear: Secondary | ICD-10-CM | POA: Diagnosis not present

## 2019-04-04 DIAGNOSIS — F411 Generalized anxiety disorder: Secondary | ICD-10-CM | POA: Diagnosis not present

## 2019-04-04 DIAGNOSIS — F339 Major depressive disorder, recurrent, unspecified: Secondary | ICD-10-CM | POA: Diagnosis not present

## 2019-04-04 DIAGNOSIS — E039 Hypothyroidism, unspecified: Secondary | ICD-10-CM | POA: Diagnosis not present

## 2019-04-04 DIAGNOSIS — J45909 Unspecified asthma, uncomplicated: Secondary | ICD-10-CM | POA: Diagnosis not present

## 2019-07-12 ENCOUNTER — Other Ambulatory Visit (HOSPITAL_COMMUNITY): Payer: Self-pay | Admitting: Family Medicine

## 2019-07-12 ENCOUNTER — Other Ambulatory Visit: Payer: Self-pay | Admitting: Family Medicine

## 2019-07-12 DIAGNOSIS — I739 Peripheral vascular disease, unspecified: Secondary | ICD-10-CM

## 2019-07-15 ENCOUNTER — Other Ambulatory Visit: Payer: Self-pay

## 2019-07-15 ENCOUNTER — Ambulatory Visit (HOSPITAL_COMMUNITY)
Admission: RE | Admit: 2019-07-15 | Discharge: 2019-07-15 | Disposition: A | Discharge status: Home / Self Care | Payer: 59 | Source: Ambulatory Visit | Attending: Family Medicine | Admitting: Family Medicine

## 2019-07-15 DIAGNOSIS — I739 Peripheral vascular disease, unspecified: Secondary | ICD-10-CM | POA: Diagnosis not present

## 2019-07-18 ENCOUNTER — Other Ambulatory Visit (HOSPITAL_COMMUNITY): Payer: Self-pay

## 2019-07-21 ENCOUNTER — Other Ambulatory Visit: Payer: Self-pay | Admitting: *Deleted

## 2019-07-21 ENCOUNTER — Ambulatory Visit (INDEPENDENT_AMBULATORY_CARE_PROVIDER_SITE_OTHER): Payer: 59 | Admitting: Vascular Surgery

## 2019-07-21 ENCOUNTER — Other Ambulatory Visit: Payer: Self-pay

## 2019-07-21 ENCOUNTER — Encounter: Payer: Self-pay | Admitting: *Deleted

## 2019-07-21 ENCOUNTER — Encounter: Payer: Self-pay | Admitting: Vascular Surgery

## 2019-07-21 VITALS — BP 105/64 | HR 76 | Temp 97.9°F | Resp 20 | Wt 146.8 lb

## 2019-07-21 DIAGNOSIS — I739 Peripheral vascular disease, unspecified: Secondary | ICD-10-CM

## 2019-07-21 MED ORDER — ASPIRIN EC 81 MG PO TBEC: 81.0000 mg | DELAYED_RELEASE_TABLET | Freq: Every day | ORAL | Status: DC

## 2019-07-21 NOTE — Progress Notes (Addendum)
Referring Physician: Judd Lien  Patient name: Pamela Pham MRN: [id number] DOB: 19-Jan-1966 Sex: female  REASON FOR CONSULT: Right second toe pain  HPI: Pamela Pham is a 54 y.o. female, who began having pain in her right neck and toe about 3 weeks ago.  She thought this was due to bumping it on a grocery cart.  However it has not improved and has actually become worse.  Of note about 25 years ago she apparently had a right iliac stent placed by Dr. Donnetta Hutching for a similar problem.  She is not currently on an aspirin but does take atorvastatin.  She smokes about a half a pack of cigarettes per day but is trying to quit with nicotine patches.  She has been a much heavier smoker in the past.  She also notes that she has developed numbness and tingling in her hip area after walking about 1-2 blocks which has been present for about a month.  Other medical problems include COPD which has been stable.  She has extensive family history of peripheral arterial disease her father and at least one other relative ended up with amputations.  Past Medical History:  Diagnosis Date  . Anxiety   . COPD (chronic obstructive pulmonary disease) ([place])   . Thyroid disease    History reviewed. No pertinent surgical history.  History reviewed. No pertinent family history.  SOCIAL HISTORY: Social History   Socioeconomic History  . Marital status: Single    Spouse name: Not on file  . Number of children: Not on file  . Years of education: Not on file  . Highest education level: Not on file  Occupational History  . Not on file  Tobacco Use  . Smoking status: Current Every Day Smoker  . Smokeless tobacco: Never Used  Substance and Sexual Activity  . Alcohol use: Not Currently  . Drug use: Never  . Sexual activity: Not on file  Other Topics Concern  . Not on file  Social History Narrative  . Not on file   Social Determinants of Health   Financial Resource Strain:   . Difficulty of Paying  Living Expenses:   Food Insecurity:   . Worried About Charity fundraiser in the Last Year:   . Arboriculturist in the Last Year:   Transportation Needs:   . Film/video editor (Medical):   Marland Kitchen Lack of Transportation (Non-Medical):   Physical Activity:   . Days of Exercise per Week:   . Minutes of Exercise per Session:   Stress:   . Feeling of Stress :   Social Connections:   . Frequency of Communication with Friends and Family:   . Frequency of Social Gatherings with Friends and Family:   . Attends Religious Services:   . Active Member of Clubs or Organizations:   . Attends Archivist Meetings:   Marland Kitchen Marital Status:   Intimate Partner Violence:   . Fear of Current or Ex-Partner:   . Emotionally Abused:   Marland Kitchen Physically Abused:   . Sexually Abused:     Allergies  Allergen Reactions  . Augmentin [Amoxicillin-Pot Clavulanate] Nausea Only  . Sulfa Antibiotics     Current Outpatient Medications  Medication Sig Dispense Refill  . albuterol (VENTOLIN HFA) 108 (90 Base) MCG/ACT inhaler     . clonazePAM (KLONOPIN) 0.5 MG tablet Take 0.5 mg by mouth daily as needed.    Marland Kitchen FLUoxetine (PROZAC) 10 MG tablet Take 10 mg by mouth daily.    Marland Kitchen  levothyroxine (SYNTHROID) 100 MCG tablet     . mirtazapine (REMERON) 45 MG tablet Take 45 mg by mouth at bedtime.    Marland Kitchen SPIRIVA HANDIHALER 18 MCG inhalation capsule 1 capsule daily.     No current facility-administered medications for this visit.    ROS:   General:  No weight loss, Fever, chills  HEENT: No recent headaches, no nasal bleeding, no visual changes, no sore throat  Neurologic: No dizziness, blackouts, seizures. No recent symptoms of stroke or mini- stroke. No recent episodes of slurred speech, or temporary blindness.  Cardiac: No recent episodes of chest pain/pressure, no shortness of breath at rest.  + shortness of breath with exertion.  Denies history of atrial fibrillation or irregular heartbeat  Vascular: No history of  rest pain in feet.  No history of claudication.  No history of non-healing ulcer, No history of DVT   Pulmonary: No home oxygen, no productive cough, no hemoptysis,  No asthma or wheezing  Musculoskeletal:  [ ]  Arthritis, [ ]  Low back pain,  [ ]  Joint pain  Hematologic:No history of hypercoagulable state.  No history of easy bleeding.  No history of anemia  Gastrointestinal: No hematochezia or melena,  No gastroesophageal reflux, no trouble swallowing  Urinary: [ ]  chronic Kidney disease, [ ]  on HD - [ ]  MWF or [ ]  TTHS, [ ]  Burning with urination, [ ]  Frequent urination, [ ]  Difficulty urinating;   Skin: No rashes  Psychological: No history of anxiety,  No history of depression   Physical Examination  Vitals:   07/21/19 1100  BP: 105/64  Pulse: 76  Resp: 20  Temp: 97.9 F (36.6 C)  SpO2: 95%  Weight: 146 lb 12.8 oz (66.6 kg)    There is no height or weight on file to calculate BMI.  General:  Alert and oriented, no acute distress HEENT: Normal Neck: No JVD Pulmonary: Clear to auscultation bilaterally Cardiac: Regular Rate and Rhythm  Abdomen: Soft, non-tender, non-distended, no mass Skin: No rash, toes 2 through 5 right foot dusky in appearance       Extremity Pulses:  2+ radial, brachial, absent femoral, dorsalis pedis, posterior tibial pulses bilaterally Musculoskeletal: No deformity or edema  Neurologic: Upper and lower extremity motor 5/5 and symmetric  DATA:  Patient had bilateral ABIs performed 2 weeks ago which were 0.72 on the right 0.68 on the left  ASSESSMENT: Right foot ischemia most likely aortoiliac occlusive disease bilaterally   PLAN: Patient was started on aspirin 81 mg once daily today in addition to her statin.  She will be scheduled for CT angiogram with runoff for next week and I will discuss with her as a virtual visit the findings of this and the plan accordingly.  She is going to make a dedicated effort to try to quit  smoking.   Ruta Hinds, MD [facility] Office: [phone] Pager: [phone]

## 2019-07-22 ENCOUNTER — Other Ambulatory Visit: Payer: Self-pay

## 2019-07-22 ENCOUNTER — Encounter (INDEPENDENT_AMBULATORY_CARE_PROVIDER_SITE_OTHER): Payer: Self-pay | Admitting: Internal Medicine

## 2019-07-22 DIAGNOSIS — I739 Peripheral vascular disease, unspecified: Secondary | ICD-10-CM

## 2019-07-27 ENCOUNTER — Other Ambulatory Visit: Payer: Self-pay

## 2019-07-27 ENCOUNTER — Telehealth (INDEPENDENT_AMBULATORY_CARE_PROVIDER_SITE_OTHER): Payer: Self-pay | Admitting: Internal Medicine

## 2019-07-27 ENCOUNTER — Ambulatory Visit (HOSPITAL_COMMUNITY)
Admission: RE | Admit: 2019-07-27 | Discharge: 2019-07-27 | Disposition: A | Discharge status: Home / Self Care | Payer: 59 | Source: Ambulatory Visit | Attending: Vascular Surgery | Admitting: Vascular Surgery

## 2019-07-27 DIAGNOSIS — I739 Peripheral vascular disease, unspecified: Secondary | ICD-10-CM

## 2019-07-27 MED ORDER — IOHEXOL 350 MG/ML SOLN
100.0000 mL | Freq: Once | INTRAVENOUS | Status: AC | PRN
  Administered 2019-07-27: 100 mL via INTRAVENOUS

## 2019-07-27 NOTE — Telephone Encounter (Signed)
I received a call from Dr. Jacqulynn Cadet who read the patient's CTA. Noted occlusion to IMA and wall thickening to a segment of sigmoid colon with subtle inflammatory changes.  He was concerned about diverticulitis ischemic injury or even neoplasm. Therefore contacted patient and patient has no symptoms. Her last colonoscopy was in August 2019 revealing multiple diverticula at sigmoid colon and she also had single tubular adenoma removed.  Past history significant for diverticulitis back in May 2019. I suspect these changes may be due to diverticulosis but will proceed with diagnostic flexible sigmoidoscopy to rule out ischemic changes.  Patient is agreeable to proceed with flexible sigmoidoscopy.

## 2019-07-28 ENCOUNTER — Other Ambulatory Visit (INDEPENDENT_AMBULATORY_CARE_PROVIDER_SITE_OTHER): Payer: Self-pay | Admitting: *Deleted

## 2019-07-28 ENCOUNTER — Encounter (INDEPENDENT_AMBULATORY_CARE_PROVIDER_SITE_OTHER): Payer: Self-pay | Admitting: *Deleted

## 2019-07-28 ENCOUNTER — Ambulatory Visit (INDEPENDENT_AMBULATORY_CARE_PROVIDER_SITE_OTHER): Payer: 59 | Admitting: Vascular Surgery

## 2019-07-28 ENCOUNTER — Other Ambulatory Visit: Payer: Self-pay

## 2019-07-28 ENCOUNTER — Telehealth (INDEPENDENT_AMBULATORY_CARE_PROVIDER_SITE_OTHER): Payer: Self-pay | Admitting: *Deleted

## 2019-07-28 ENCOUNTER — Encounter: Payer: Self-pay | Admitting: Vascular Surgery

## 2019-07-28 DIAGNOSIS — I739 Peripheral vascular disease, unspecified: Secondary | ICD-10-CM

## 2019-07-28 DIAGNOSIS — R935 Abnormal findings on diagnostic imaging of other abdominal regions, including retroperitoneum: Secondary | ICD-10-CM

## 2019-07-28 DIAGNOSIS — K5732 Diverticulitis of large intestine without perforation or abscess without bleeding: Secondary | ICD-10-CM

## 2019-07-28 NOTE — H&P (View-Only) (Signed)
Virtual Visit via Telephone Note  I connected with Pamela Pham on 07/28/2019 using the [employer] by telephone and verified that I was speaking with the correct person using two identifiers. Patient was located at home and accompanied by no one. I am located at our office on [facility].   The limitations of evaluation and management by telemedicine and the availability of in person appointments have been previously discussed with the patient and are documented in the patients chart. The patient expressed understanding and consented to proceed.  PCP: Curlene Labrum, MD  History of Present Illness: Pamela Pham is a 54 y.o. 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  When I spoke with her today she is still having short distance claudication.  She still has pain in the right fifth toe.  None of this is really changed since her office visit a few weeks ago.  Of note she had a CT scan for further evaluation of a previously placed stent and it was noted that she had evidence of possible diverticulitis.  This was asymptomatic.  She is scheduled for a partial colonoscopy by her gastroenterologist in a couple of weeks.  She is taking a daily aspirin.  She is trying to quit smoking.  Past Medical History:  Diagnosis Date  . Abnormal Pap smear of cervix   . Anxiety   . Asthma   . COPD (chronic obstructive pulmonary disease) ([place])   . Depression   . Dysmenorrhea   . HOH (hard of hearing)    right ear  . Hypothyroidism   . Migraines   . Substance abuse ([place])    recovering addict  . Thyroid disease     Past Surgical History:  Procedure Laterality Date  . ABDOMINAL HYSTERECTOMY    . CERVICAL BIOPSY  W/ LOOP ELECTRODE EXCISION     in pts 20's  . CESAREAN SECTION  2003  . CHOLECYSTECTOMY     in late 20's  . COLONOSCOPY WITH PROPOFOL N/A 12/04/2017   Procedure: COLONOSCOPY  WITH PROPOFOL;  Surgeon: Rogene Houston, MD;  [facility];  Service: Endoscopy;  Laterality: N/A;  1:35-moved to 1235 per Lelon Frohlich - office unable to reach patient to move them up  . CYSTOSCOPY N/A 12/19/2013   Procedure: CYSTOSCOPY;  Surgeon: Lyman Speller, MD;  Location: [facility];  Service: Gynecology;  Laterality: N/A;  . FOREIGN BODY REMOVAL Right 02/02/2013   Procedure: Exploration right forearm with removal foreign body;  Surgeon: Schuyler Amor, MD;  Location: [facility];  Service: Orthopedics;  Laterality: Right;  Foreign body given to patient.  Marland Kitchen ILIAC ARTERY STENT     age 89, congenital stricture of vessel  . POLYPECTOMY  12/04/2017   Procedure: POLYPECTOMY;  Surgeon: Rogene Houston, MD;  [facility];  Service: Endoscopy;;  colon  . ROBOTIC ASSISTED TOTAL HYSTERECTOMY N/A 12/19/2013   Procedure: ROBOTIC ASSISTED TOTAL HYSTERECTOMY with bilateral salpingectomy .;  Surgeon: Lyman Speller, MD;  Location: [facility];  Service: Gynecology;  Laterality: N/A;    Current Meds  Medication Sig  . albuterol (PROAIR HFA) 108 (90 Base) MCG/ACT inhaler Inhale 2 puffs into the lungs every 6 (six) hours as needed for wheezing or shortness of breath.   Marland Kitchen albuterol (VENTOLIN HFA) 108 (90 Base) MCG/ACT inhaler   . atorvastatin (LIPITOR) 40 MG tablet Take 40 mg  by mouth at bedtime.   . clonazePAM (KLONOPIN) 0.5 MG tablet Take 0.5 mg by mouth every evening.   . clonazePAM (KLONOPIN) 0.5 MG tablet Take 0.5 mg by mouth daily as needed.  . dicyclomine (BENTYL) 10 MG capsule Take 1 capsule (10 mg total) by mouth 3 (three) times daily as needed for spasms.  Marland Kitchen docusate sodium (COLACE) 100 MG capsule Take 2 capsules (200 mg total) by mouth daily.  Marland Kitchen FLUoxetine (PROZAC) 10 MG tablet Take 10 mg by mouth daily.  Marland Kitchen levothyroxine (SYNTHROID) 100 MCG tablet   . levothyroxine (SYNTHROID, LEVOTHROID) 112 MCG tablet Take 112 mcg by mouth daily before breakfast.  . mirtazapine  (REMERON) 45 MG tablet Take 45 mg by mouth at bedtime.   . mirtazapine (REMERON) 45 MG tablet Take 45 mg by mouth at bedtime.  Marland Kitchen SPIRIVA HANDIHALER 18 MCG inhalation capsule 1 capsule daily.  . Wheat Dextrin (BENEFIBER DRINK MIX) PACK Take 4 g by mouth at bedtime.   Current Facility-Administered Medications for the 07/28/19 encounter (Office Visit) with Elam Dutch, MD  Medication  . aspirin EC tablet 81 mg   View of systems: She has no shortness of breath.  She has no chest pain.   Data: I reviewed the patient's CT angiogram of the abdomen and pelvis.  Her lower extremity runoff is intact.  In the aorta she has a stent in the infrarenal abdominal aorta.  This has irregular plaque which certainly could be a nidus for atheroembolization.  The previously placed aortic stent is about 4 cm below the renal arteries.  The aorta measures 13 mm in diameter in this location and then tapers to 10 mm at the aortic bifurcation.  The stent starts again about 4 cm below the renal arteries and the lesion including the stent is about 3 cm.   Assessment and Plan: Patient with peripheral arterial disease atheroembolic event most likely secondary to debris from her previously placed aortic stent which now has accumulated skin significant recurrent atherosclerosis.  I discussed with the patient the possibility of aortobifemoral bypass versus restenting the aortic area.  She would like to go with restenting at this point.  I did discuss with her the durability of this may not be as good as aortobifemoral bypass grafting but would be less invasive.  She is scheduled to have a partial colonoscopy in the near future to evaluate for diverticulitis.  I hold her we will schedule her office visit after this to make sure that she does not have any infectious problems going on before we placed any prosthetic in her aorta.  She is scheduled for an office visit with me in a couple of weeks.  At that point we will schedule  her for aortic stenting if she is up to it at that point.  I spoke with a [employer] rep today and we should be able to place a Viabahn stent that we would be able to dilate up to as much as 16 mm through an 8 [place] sheath.  I spent 15 minutes with the patient via telephone encounter and reviewing her images.   Signed, Ruta Hinds [facility] Office: [phone]  07/28/2019, 5:02 PM

## 2019-07-28 NOTE — Progress Notes (Signed)
Virtual Visit via Telephone Note  I connected with Pamela Pham on 07/28/2019 using the [employer] by telephone and verified that I was speaking with the correct person using two identifiers. Patient was located at home and accompanied by no one. I am located at our office on [facility].   The limitations of evaluation and management by telemedicine and the availability of in person appointments have been previously discussed with the patient and are documented in the patients chart. The patient expressed understanding and consented to proceed.  PCP: Curlene Labrum, MD  History of Present Illness: Pamela Pham is a 54 y.o. 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  When I spoke with her today she is still having short distance claudication.  She still has pain in the right fifth toe.  None of this is really changed since her office visit a few weeks ago.  Of note she had a CT scan for further evaluation of a previously placed stent and it was noted that she had evidence of possible diverticulitis.  This was asymptomatic.  She is scheduled for a partial colonoscopy by her gastroenterologist in a couple of weeks.  She is taking a daily aspirin.  She is trying to quit smoking.  Past Medical History:  Diagnosis Date  . Abnormal Pap smear of cervix   . Anxiety   . Asthma   . COPD (chronic obstructive pulmonary disease) ([place])   . Depression   . Dysmenorrhea   . HOH (hard of hearing)    right ear  . Hypothyroidism   . Migraines   . Substance abuse ([place])    recovering addict  . Thyroid disease     Past Surgical History:  Procedure Laterality Date  . ABDOMINAL HYSTERECTOMY    . CERVICAL BIOPSY  W/ LOOP ELECTRODE EXCISION     in pts 20's  . CESAREAN SECTION  2003  . CHOLECYSTECTOMY     in late 20's  . COLONOSCOPY WITH PROPOFOL N/A 12/04/2017   Procedure: COLONOSCOPY  WITH PROPOFOL;  Surgeon: Rogene Houston, MD;  [facility];  Service: Endoscopy;  Laterality: N/A;  1:35-moved to 1235 per Lelon Frohlich - office unable to reach patient to move them up  . CYSTOSCOPY N/A 12/19/2013   Procedure: CYSTOSCOPY;  Surgeon: Lyman Speller, MD;  Location: [facility];  Service: Gynecology;  Laterality: N/A;  . FOREIGN BODY REMOVAL Right 02/02/2013   Procedure: Exploration right forearm with removal foreign body;  Surgeon: Schuyler Amor, MD;  Location: [facility];  Service: Orthopedics;  Laterality: Right;  Foreign body given to patient.  Marland Kitchen ILIAC ARTERY STENT     age 5, congenital stricture of vessel  . POLYPECTOMY  12/04/2017   Procedure: POLYPECTOMY;  Surgeon: Rogene Houston, MD;  [facility];  Service: Endoscopy;;  colon  . ROBOTIC ASSISTED TOTAL HYSTERECTOMY N/A 12/19/2013   Procedure: ROBOTIC ASSISTED TOTAL HYSTERECTOMY with bilateral salpingectomy .;  Surgeon: Lyman Speller, MD;  Location: [facility];  Service: Gynecology;  Laterality: N/A;    Current Meds  Medication Sig  . albuterol (PROAIR HFA) 108 (90 Base) MCG/ACT inhaler Inhale 2 puffs into the lungs every 6 (six) hours as needed for wheezing or shortness of breath.   Marland Kitchen albuterol (VENTOLIN HFA) 108 (90 Base) MCG/ACT inhaler   . atorvastatin (LIPITOR) 40 MG tablet Take 40 mg  by mouth at bedtime.   . clonazePAM (KLONOPIN) 0.5 MG tablet Take 0.5 mg by mouth every evening.   . clonazePAM (KLONOPIN) 0.5 MG tablet Take 0.5 mg by mouth daily as needed.  . dicyclomine (BENTYL) 10 MG capsule Take 1 capsule (10 mg total) by mouth 3 (three) times daily as needed for spasms.  Marland Kitchen docusate sodium (COLACE) 100 MG capsule Take 2 capsules (200 mg total) by mouth daily.  Marland Kitchen FLUoxetine (PROZAC) 10 MG tablet Take 10 mg by mouth daily.  Marland Kitchen levothyroxine (SYNTHROID) 100 MCG tablet   . levothyroxine (SYNTHROID, LEVOTHROID) 112 MCG tablet Take 112 mcg by mouth daily before breakfast.  . mirtazapine  (REMERON) 45 MG tablet Take 45 mg by mouth at bedtime.   . mirtazapine (REMERON) 45 MG tablet Take 45 mg by mouth at bedtime.  Marland Kitchen SPIRIVA HANDIHALER 18 MCG inhalation capsule 1 capsule daily.  . Wheat Dextrin (BENEFIBER DRINK MIX) PACK Take 4 g by mouth at bedtime.   Current Facility-Administered Medications for the 07/28/19 encounter (Office Visit) with Elam Dutch, MD  Medication  . aspirin EC tablet 81 mg   View of systems: She has no shortness of breath.  She has no chest pain.   Data: I reviewed the patient's CT angiogram of the abdomen and pelvis.  Her lower extremity runoff is intact.  In the aorta she has a stent in the infrarenal abdominal aorta.  This has irregular plaque which certainly could be a nidus for atheroembolization.  The previously placed aortic stent is about 4 cm below the renal arteries.  The aorta measures 13 mm in diameter in this location and then tapers to 10 mm at the aortic bifurcation.  The stent starts again about 4 cm below the renal arteries and the lesion including the stent is about 3 cm.   Assessment and Plan: Patient with peripheral arterial disease atheroembolic event most likely secondary to debris from her previously placed aortic stent which now has accumulated skin significant recurrent atherosclerosis.  I discussed with the patient the possibility of aortobifemoral bypass versus restenting the aortic area.  She would like to go with restenting at this point.  I did discuss with her the durability of this may not be as good as aortobifemoral bypass grafting but would be less invasive.  She is scheduled to have a partial colonoscopy in the near future to evaluate for diverticulitis.  I hold her we will schedule her office visit after this to make sure that she does not have any infectious problems going on before we placed any prosthetic in her aorta.  She is scheduled for an office visit with me in a couple of weeks.  At that point we will schedule  her for aortic stenting if she is up to it at that point.  I spoke with a [employer] rep today and we should be able to place a Viabahn stent that we would be able to dilate up to as much as 16 mm through an 8 [place] sheath.  I spent 15 minutes with the patient via telephone encounter and reviewing her images.   Signed, Ruta Hinds [facility] [place] Office: [phone]  07/28/2019, 5:02 PM

## 2019-07-28 NOTE — Telephone Encounter (Signed)
Referring MD/PCP:    Procedure: flex sig  Reason/Indication:  abnormal CT, hx diverticulitis  Has patient had this procedure before?  TCS 11/2017  If so, when, by whom and where?    Is there a family history of colon cancer?    Who?  What age when diagnosed?    Is patient diabetic?   no      Does patient have prosthetic heart valve or mechanical valve?  no  Do you have a pacemaker/defibrillator?  no  Has patient ever had endocarditis/atrial fibrillation? no  Does patient use oxygen? no  Has patient had joint replacement within last 12 months?  no  Is patient constipated or do they take laxatives? no  Does patient have a history of alcohol/drug use?  no  Is patient on blood thinner such as Coumadin, Plavix and/or Aspirin? on  Medications: albuterol 2 puffs daily, atorvastatin 40 mg daily, clonazepam 0.5 mg prn, dicyclomine 10 mg tid, colace 100 mg daily, fluoxetine 10 mg daily, levothyroxine 112 mcg daily, remeron 45 mg daily, spiriva daily  07/27/19 NO NEED FOR PROPOFOL PER DR REHMAN  Allergies: augmentin, codeine, sulfur antibiotics  Medication Adjustment per Dr Laural Golden:   Procedure date & time: 08/11/19

## 2019-08-01 ENCOUNTER — Telehealth (INDEPENDENT_AMBULATORY_CARE_PROVIDER_SITE_OTHER): Payer: Self-pay | Admitting: Internal Medicine

## 2019-08-01 NOTE — Telephone Encounter (Signed)
Patient called stated she has another blockage and is going to have vascular surgery - also states her 2nd toe is not getting any better  - would like to talk to Dr Laural Golden about her procedure -please advise - [phone]

## 2019-08-01 NOTE — Telephone Encounter (Signed)
Se other note

## 2019-08-01 NOTE — Telephone Encounter (Signed)
Flexible sigmoidoscopy with conscious sedation

## 2019-08-04 ENCOUNTER — Encounter (INDEPENDENT_AMBULATORY_CARE_PROVIDER_SITE_OTHER): Payer: Self-pay | Admitting: *Deleted

## 2019-08-04 NOTE — Telephone Encounter (Signed)
Patient was called , went to voice mail. Voice Mail is full unable to leave a message. Patient will be sent a My Chart message.

## 2019-08-08 IMAGING — CT CT ABD-PELV W/ CM
2 of 5 series · 16 of 46 positions shown, 18 images · IV contrast (Isovue)
Comparison: Patient's prior CT from 8665 is not available on PACs
for comparison.

CLINICAL DATA: Abdominal pain below the umbilicus since [REDACTED].

EXAM:
CT ABDOMEN AND PELVIS WITH CONTRAST
TECHNIQUE: Multidetector CT imaging of the abdomen and pelvis was performed
using the standard protocol following bolus administration of
intravenous contrast.
CONTRAST:  100mL DFENQS-XOO IOPAMIDOL (DFENQS-XOO) INJECTION 61%

[Series 2: axial st · axial · 0.68mm/px · z∈[-228,+146]mm · 13 of 85 slices shown, 15 images]
[im 5/85  soft-tissue]
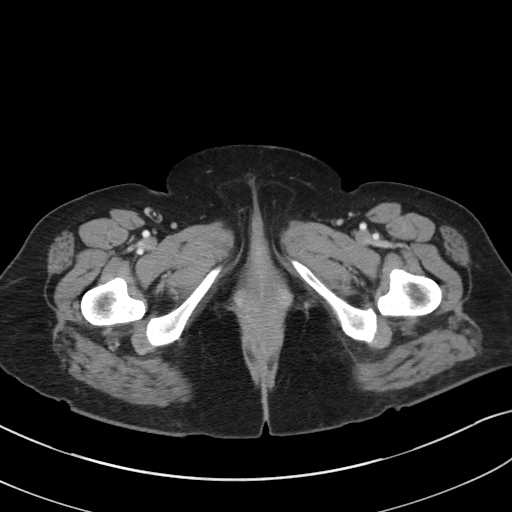
[im 5/85  bone]
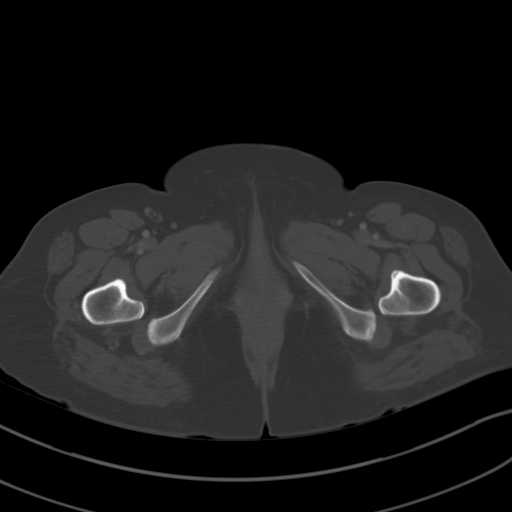
[im 14/85  soft-tissue]
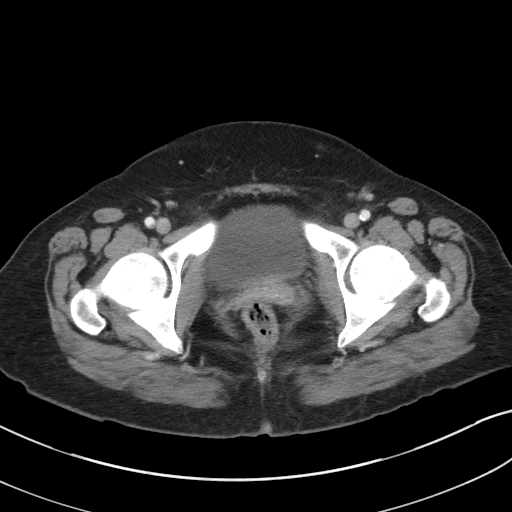
[im 18/85  soft-tissue]
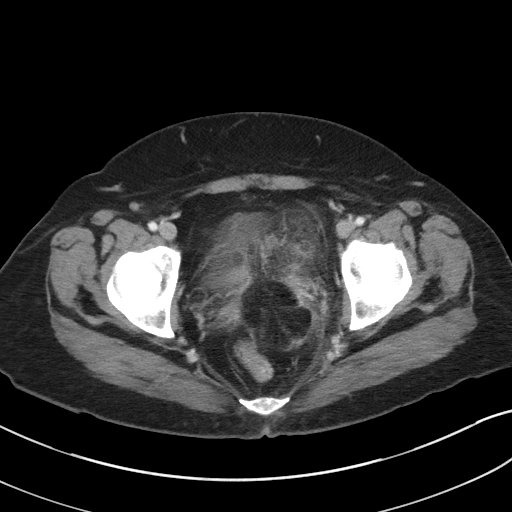
[im 23/85  soft-tissue]
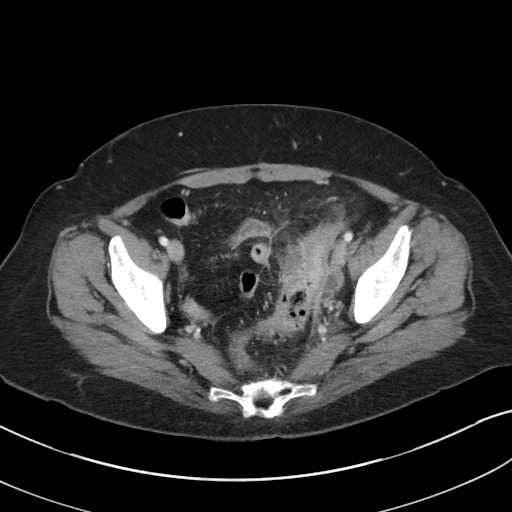
[im 31/85  soft-tissue]
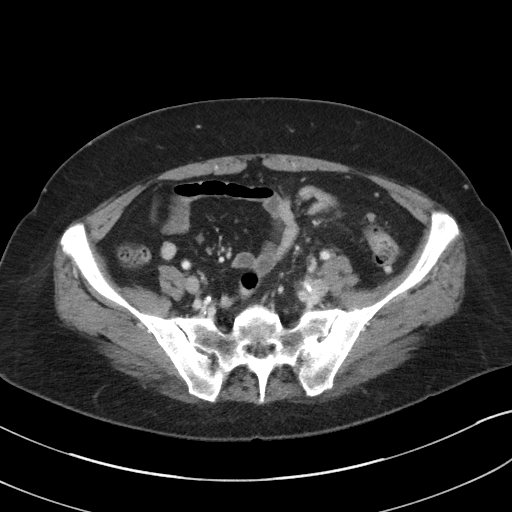
[im 36/85  soft-tissue]
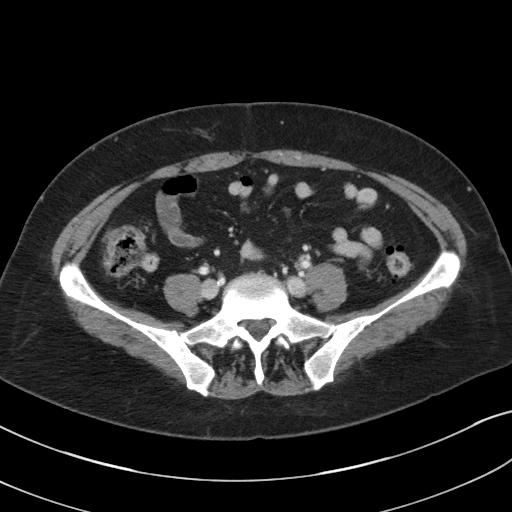
[im 45/85  soft-tissue]
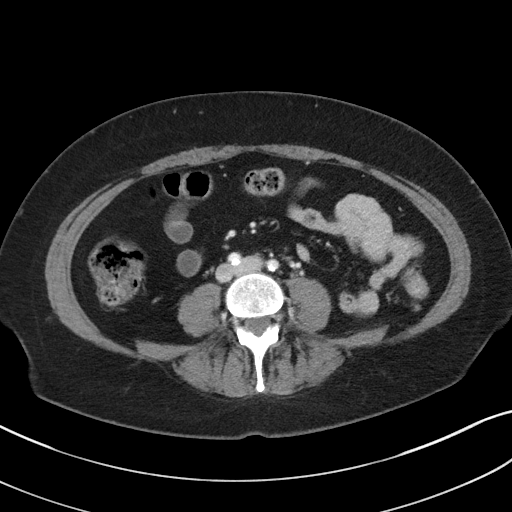
[im 49/85  soft-tissue]
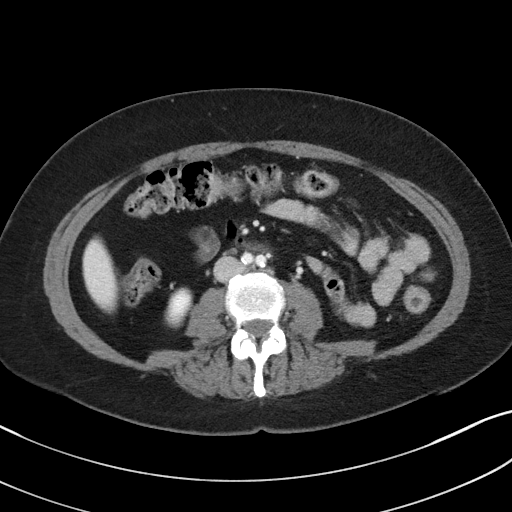
[im 54/85  soft-tissue]
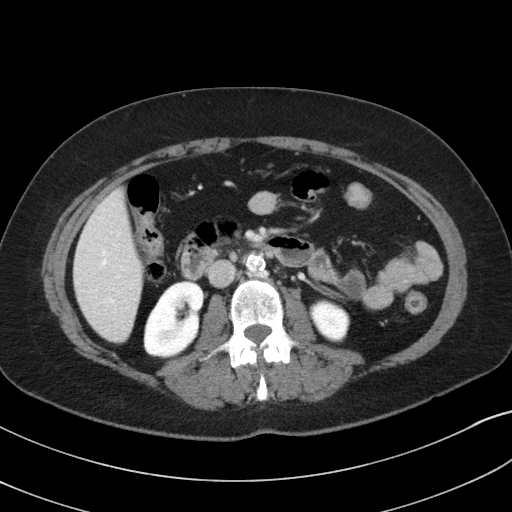
[im 54/85  bone]
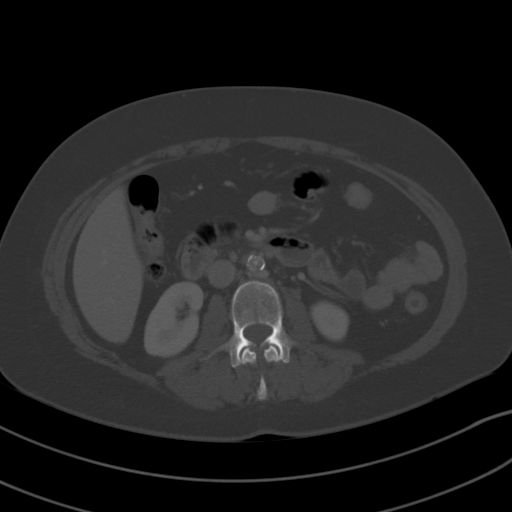
[im 62/85  soft-tissue]
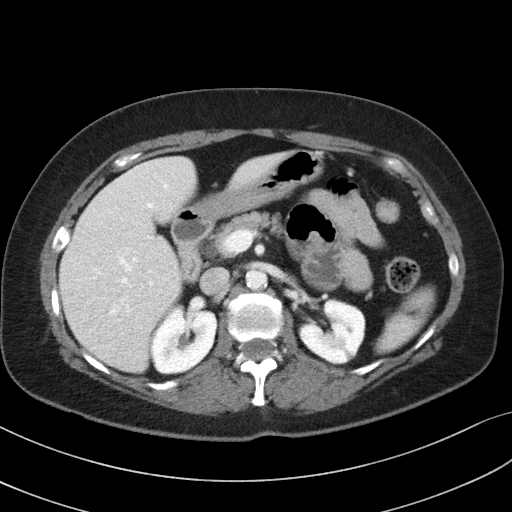
[im 67/85  soft-tissue]
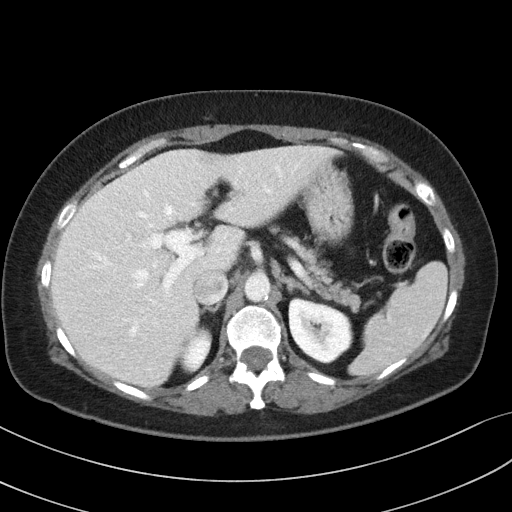
[im 71/85  soft-tissue]
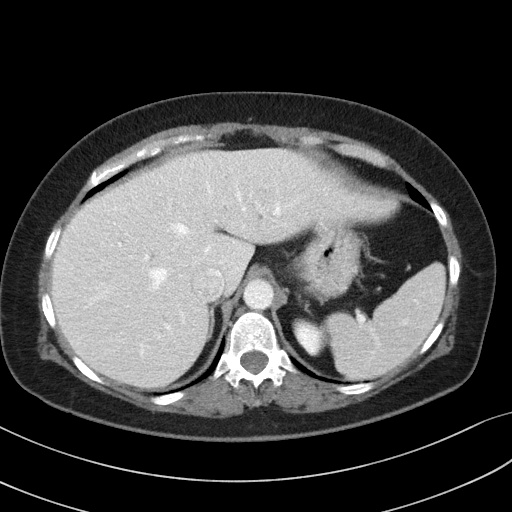
[im 80/85  soft-tissue]
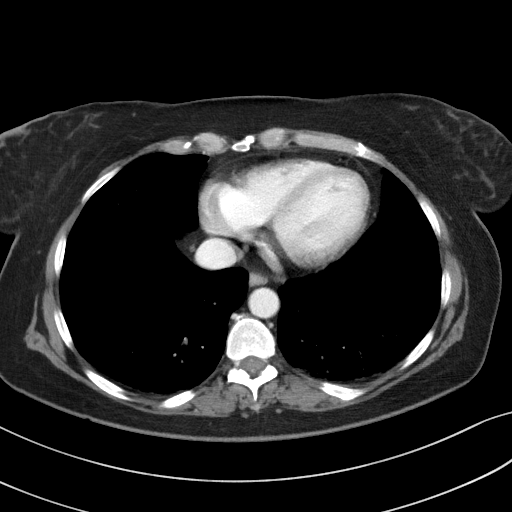

[Series 6: coronal st · coronal · 0.74mm/px · 3 of 96 slices shown]
[im 32/96  soft-tissue]
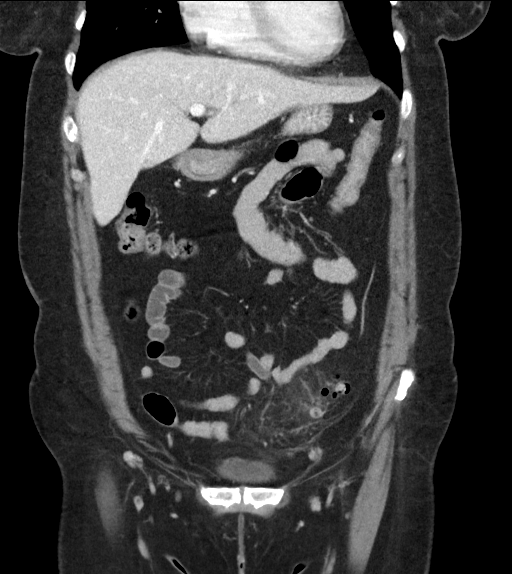
[im 43/96  soft-tissue]
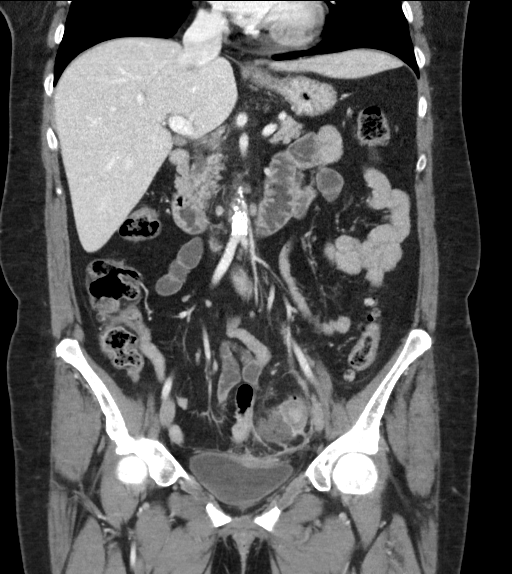
[im 53/96  soft-tissue]
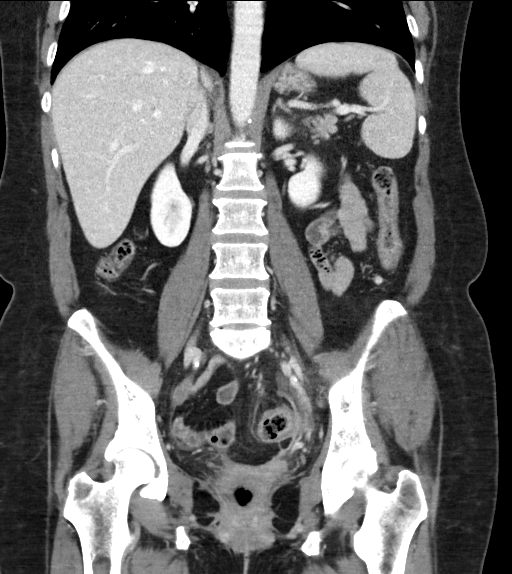

[16 of 46 positions shown; findings below may reference images not displayed]

FINDINGS: Lower chest: Minimal atelectasis of bilateral lung bases are noted.
Heart size is normal.

Hepatobiliary: No focal liver abnormality is seen. Status post
cholecystectomy. No biliary dilatation.

Pancreas: Unremarkable. No pancreatic ductal dilatation or
surrounding inflammatory changes.

Spleen: Normal in size without focal abnormality.

Adrenals/Urinary Tract: The adrenal glands are normal. There is no
hydronephrosis bilaterally. There is a small cyst in the left
kidney. The right kidney is normal. The bladder is normal.

Stomach/Bowel: Stomach is normal. There is no small bowel
obstruction. The appendix is normal. There is thickening of sigmoid
colon with surrounding inflammation and

2.8 cm slight low-density mass.  There is no colonic obstruction.

Vascular/Lymphatic: Aortic atherosclerosis. No enlarged abdominal or
pelvic lymph nodes.

Reproductive: Status post hysterectomy. No adnexal masses.

Other: Minimal free fluid is identified in the pelvis.

Musculoskeletal: No acute or significant osseous findings.
IMPRESSION: Thick walled sigmoid colon with surrounding inflammation and 2.8 cm
slight low-density mass probably due to acute diverticulitis with
small focal area of phlegmon. Follow-up CT scan after treatment is
recommended to ensure complete resolution and that no underlying
mass is present.

## 2019-08-09 ENCOUNTER — Other Ambulatory Visit (HOSPITAL_COMMUNITY)
Admission: RE | Admit: 2019-08-09 | Discharge: 2019-08-09 | Disposition: A | Discharge status: Home / Self Care | Payer: 59 | Source: Ambulatory Visit | Attending: Vascular Surgery | Admitting: Vascular Surgery

## 2019-08-09 ENCOUNTER — Other Ambulatory Visit: Payer: Self-pay

## 2019-08-10 ENCOUNTER — Telehealth: Payer: Self-pay

## 2019-08-10 ENCOUNTER — Other Ambulatory Visit: Payer: Self-pay

## 2019-08-10 NOTE — Telephone Encounter (Addendum)
Message given to surgery scheduler.  Thurston Hole., LPN   ----- Message from Elam Dutch, MD sent at 08/08/2019  6:38 PM EDT ----- Regarding: RE: Stent Options Contact: [phone] Spoke with pt plan for aortic stent in the PV lab on Friday 4/30.  Please contact the patient with times dates places she is awaiting your call.  Ruta Hinds ----- Message ----- From: Kaleen Mask, LPN Sent: [id number]  [id number] PM EDT To: Elam Dutch, MD Subject: Stent Options                                  Hello.  This patient called and left a message wanting you to call her to discuss the options you gave her regarding a stent.  [phone].  Thanks,  Thurston Hole., LPN

## 2019-08-11 ENCOUNTER — Encounter (HOSPITAL_COMMUNITY): Admission: RE | Payer: Self-pay | Source: Home / Self Care

## 2019-08-11 ENCOUNTER — Ambulatory Visit (HOSPITAL_COMMUNITY): Admission: RE | Admit: 2019-08-11 | Payer: 59 | Source: Home / Self Care | Admitting: Internal Medicine

## 2019-08-11 SURGERY — SIGMOIDOSCOPY, FLEXIBLE
Anesthesia: Moderate Sedation

## 2019-08-18 ENCOUNTER — Ambulatory Visit: Payer: 59 | Admitting: Vascular Surgery

## 2019-08-23 ENCOUNTER — Other Ambulatory Visit (HOSPITAL_COMMUNITY)
Admission: RE | Admit: 2019-08-23 | Discharge: 2019-08-23 | Disposition: A | Discharge status: Home / Self Care | Payer: 59 | Source: Ambulatory Visit | Attending: Vascular Surgery | Admitting: Vascular Surgery

## 2019-08-23 DIAGNOSIS — Z20822 Contact with and (suspected) exposure to covid-19: Secondary | ICD-10-CM | POA: Insufficient documentation

## 2019-08-23 DIAGNOSIS — Z01812 Encounter for preprocedural laboratory examination: Secondary | ICD-10-CM | POA: Insufficient documentation

## 2019-08-23 LAB — SARS CORONAVIRUS 2 (TAT 6-24 HRS): SARS Coronavirus 2: NEGATIVE

## 2019-08-25 ENCOUNTER — Ambulatory Visit: Payer: 59 | Admitting: Vascular Surgery

## 2019-08-26 ENCOUNTER — Encounter (HOSPITAL_COMMUNITY)
Admission: RE | Disposition: A | Discharge status: Home / Self Care | Payer: Self-pay | Source: Home / Self Care | Attending: Vascular Surgery

## 2019-08-26 ENCOUNTER — Ambulatory Visit (HOSPITAL_COMMUNITY)
Admission: RE | Admit: 2019-08-26 | Discharge: 2019-08-26 | Disposition: A | Discharge status: Home / Self Care | Payer: 59 | Attending: Vascular Surgery | Admitting: Vascular Surgery

## 2019-08-26 ENCOUNTER — Other Ambulatory Visit: Payer: Self-pay

## 2019-08-26 DIAGNOSIS — Z79890 Hormone replacement therapy (postmenopausal): Secondary | ICD-10-CM | POA: Diagnosis not present

## 2019-08-26 DIAGNOSIS — M79674 Pain in right toe(s): Secondary | ICD-10-CM | POA: Insufficient documentation

## 2019-08-26 DIAGNOSIS — F419 Anxiety disorder, unspecified: Secondary | ICD-10-CM | POA: Diagnosis not present

## 2019-08-26 DIAGNOSIS — Z7982 Long term (current) use of aspirin: Secondary | ICD-10-CM | POA: Diagnosis not present

## 2019-08-26 DIAGNOSIS — J449 Chronic obstructive pulmonary disease, unspecified: Secondary | ICD-10-CM | POA: Diagnosis not present

## 2019-08-26 DIAGNOSIS — E039 Hypothyroidism, unspecified: Secondary | ICD-10-CM | POA: Diagnosis not present

## 2019-08-26 DIAGNOSIS — F329 Major depressive disorder, single episode, unspecified: Secondary | ICD-10-CM | POA: Insufficient documentation

## 2019-08-26 DIAGNOSIS — I739 Peripheral vascular disease, unspecified: Secondary | ICD-10-CM | POA: Insufficient documentation

## 2019-08-26 DIAGNOSIS — Z79899 Other long term (current) drug therapy: Secondary | ICD-10-CM | POA: Diagnosis not present

## 2019-08-26 DIAGNOSIS — I70213 Atherosclerosis of native arteries of extremities with intermittent claudication, bilateral legs: Secondary | ICD-10-CM

## 2019-08-26 HISTORY — PX: ABDOMINAL AORTOGRAM W/LOWER EXTREMITY: CATH118223

## 2019-08-26 HISTORY — PX: AORTIC INTERVENTION: CATH118225

## 2019-08-26 LAB — POCT I-STAT, CHEM 8
BUN: 10 mg/dL (ref 6–20)
Calcium, Ion: 1.14 mmol/L — ABNORMAL LOW (ref 1.15–1.40)
Chloride: 103 mmol/L (ref 98–111)
Creatinine, Ser: 0.9 mg/dL (ref 0.44–1.00)
Glucose, Bld: 88 mg/dL (ref 70–99)
HCT: 42 % (ref 36.0–46.0)
Hemoglobin: 14.3 g/dL (ref 12.0–15.0)
Potassium: 4.3 mmol/L (ref 3.5–5.1)
Sodium: 142 mmol/L (ref 135–145)
TCO2: 29 mmol/L (ref 22–32)

## 2019-08-26 LAB — POCT ACTIVATED CLOTTING TIME: Activated Clotting Time: 230 seconds

## 2019-08-26 SURGERY — AORTIC INTERVENTION
Anesthesia: LOCAL

## 2019-08-26 MED ORDER — HEPARIN SODIUM (PORCINE) 1000 UNIT/ML IJ SOLN
INTRAMUSCULAR | Status: DC | PRN
  Administered 2019-08-26: 8000 [IU] via INTRAVENOUS
  Administered 2019-08-26: 3000 [IU] via INTRAVENOUS

## 2019-08-26 MED ORDER — IODIXANOL 320 MG/ML IV SOLN
INTRAVENOUS | Status: DC | PRN
  Administered 2019-08-26: 150 mL via INTRA_ARTERIAL

## 2019-08-26 MED ORDER — SODIUM CHLORIDE 0.9% FLUSH: 3.0000 mL | INTRAVENOUS | Status: DC | PRN

## 2019-08-26 MED ORDER — SODIUM CHLORIDE 0.9% FLUSH: 3.0000 mL | Freq: Two times a day (BID) | INTRAVENOUS | Status: DC

## 2019-08-26 MED ORDER — LIDOCAINE HCL (PF) 1 % IJ SOLN
INTRAMUSCULAR | Status: AC
  Filled 2019-08-26: qty 30

## 2019-08-26 MED ORDER — MORPHINE SULFATE (PF) 2 MG/ML IV SOLN: 2.0000 mg | INTRAVENOUS | Status: DC | PRN

## 2019-08-26 MED ORDER — LABETALOL HCL 5 MG/ML IV SOLN: 10.0000 mg | INTRAVENOUS | Status: DC | PRN

## 2019-08-26 MED ORDER — SODIUM CHLORIDE 0.9 % IV SOLN: INTRAVENOUS | Status: AC

## 2019-08-26 MED ORDER — CLOPIDOGREL BISULFATE 75 MG PO TABS: 75.0000 mg | ORAL_TABLET | Freq: Every day | ORAL | 11 refills | Status: DC

## 2019-08-26 MED ORDER — SODIUM CHLORIDE 0.9 % IV SOLN: INTRAVENOUS | Status: DC

## 2019-08-26 MED ORDER — CLOPIDOGREL BISULFATE 75 MG PO TABS: 75.0000 mg | ORAL_TABLET | Freq: Every day | ORAL | Status: DC

## 2019-08-26 MED ORDER — LIDOCAINE HCL (PF) 1 % IJ SOLN
INTRAMUSCULAR | Status: DC | PRN
  Administered 2019-08-26: 15 mL

## 2019-08-26 MED ORDER — HEPARIN SODIUM (PORCINE) 1000 UNIT/ML IJ SOLN
INTRAMUSCULAR | Status: AC
  Filled 2019-08-26: qty 1

## 2019-08-26 MED ORDER — HYDRALAZINE HCL 20 MG/ML IJ SOLN: 5.0000 mg | INTRAMUSCULAR | Status: DC | PRN

## 2019-08-26 MED ORDER — MIDAZOLAM HCL 2 MG/2ML IJ SOLN
INTRAMUSCULAR | Status: DC | PRN
  Administered 2019-08-26: 2 mg via INTRAVENOUS

## 2019-08-26 MED ORDER — PROTAMINE SULFATE 10 MG/ML IV SOLN
INTRAVENOUS | Status: AC
  Filled 2019-08-26: qty 5

## 2019-08-26 MED ORDER — SODIUM CHLORIDE 0.9 % IV SOLN: 250.0000 mL | INTRAVENOUS | Status: DC | PRN

## 2019-08-26 MED ORDER — CLOPIDOGREL BISULFATE 75 MG PO TABS
300.0000 mg | ORAL_TABLET | Freq: Once | ORAL | Status: AC
  Administered 2019-08-26: 300 mg via ORAL
  Filled 2019-08-26: qty 4

## 2019-08-26 MED ORDER — PROTAMINE SULFATE 10 MG/ML IV SOLN
INTRAVENOUS | Status: DC | PRN
  Administered 2019-08-26: 25 mg via INTRAVENOUS

## 2019-08-26 MED ORDER — HEPARIN (PORCINE) IN NACL 1000-0.9 UT/500ML-% IV SOLN
INTRAVENOUS | Status: DC | PRN
  Administered 2019-08-26 (×2): 500 mL

## 2019-08-26 MED ORDER — MIDAZOLAM HCL 2 MG/2ML IJ SOLN
INTRAMUSCULAR | Status: AC
  Filled 2019-08-26: qty 2

## 2019-08-26 MED ORDER — HEPARIN (PORCINE) IN NACL 1000-0.9 UT/500ML-% IV SOLN
INTRAVENOUS | Status: AC
  Filled 2019-08-26: qty 1000

## 2019-08-26 MED ORDER — ONDANSETRON HCL 4 MG/2ML IJ SOLN: 4.0000 mg | Freq: Four times a day (QID) | INTRAMUSCULAR | Status: DC | PRN

## 2019-08-26 MED ORDER — OXYCODONE HCL 5 MG PO TABS: 5.0000 mg | ORAL_TABLET | ORAL | Status: DC | PRN

## 2019-08-26 MED ORDER — ASPIRIN EC 81 MG PO TBEC: 81.0000 mg | DELAYED_RELEASE_TABLET | Freq: Every day | ORAL | Status: DC

## 2019-08-26 MED ORDER — ACETAMINOPHEN 325 MG PO TABS: 650.0000 mg | ORAL_TABLET | ORAL | Status: DC | PRN

## 2019-08-26 MED ORDER — FENTANYL CITRATE (PF) 100 MCG/2ML IJ SOLN
INTRAMUSCULAR | Status: DC | PRN
  Administered 2019-08-26 (×3): 25 ug via INTRAVENOUS

## 2019-08-26 MED ORDER — FENTANYL CITRATE (PF) 100 MCG/2ML IJ SOLN
INTRAMUSCULAR | Status: AC
  Filled 2019-08-26: qty 2

## 2019-08-26 SURGICAL SUPPLY — 16 items
CATH ANGIO 5F BER2 65CM (CATHETERS) ×1 IMPLANT
CATH ANGIO 5F PIGTAIL 65CM (CATHETERS) ×1 IMPLANT
DEVICE CLOSURE PERCLS PRGLD 6F (VASCULAR PRODUCTS) IMPLANT
KIT ENCORE 26 ADVANTAGE (KITS) ×1 IMPLANT
KIT PV (KITS) ×3 IMPLANT
PERCLOSE PROGLIDE 6F (VASCULAR PRODUCTS) ×6
SHEATH BRITE TIP 8FR 35CM (SHEATH) ×1 IMPLANT
SHEATH PINNACLE 5F 10CM (SHEATH) ×1 IMPLANT
SHEATH PROBE COVER 6X72 (BAG) ×1 IMPLANT
STENT VIABAHNBX 11X29X135 (Permanent Stent) ×1 IMPLANT
SYR MEDRAD MARK V 150ML (SYRINGE) ×1 IMPLANT
TRANSDUCER W/STOPCOCK (MISCELLANEOUS) ×3 IMPLANT
TRAY PV CATH (CUSTOM PROCEDURE TRAY) ×3 IMPLANT
WIRE BENTSON .035X145CM (WIRE) ×1 IMPLANT
WIRE ROSEN-J .035X180CM (WIRE) ×1 IMPLANT
WIRE ROSEN-J .035X260CM (WIRE) ×1 IMPLANT

## 2019-08-26 NOTE — Progress Notes (Signed)
Discharge instructions reviewed with pt and pt's step-dad and mom. Verbalized understanding

## 2019-08-26 NOTE — Interval H&P Note (Signed)
History and Physical Interval Note:  08/26/2019 11:02 AM  Eboni Bambrick  has presented today for surgery, with the diagnosis of PAD.  The various methods of treatment have been discussed with the patient and family. After consideration of risks, benefits and other options for treatment, the patient has consented to  Procedure(s): AORTIC INTERVENTION (N/A) as a surgical intervention.  The patient's history has been reviewed, patient examined, no change in status, stable for surgery.  I have reviewed the patient's chart and labs.  Questions were answered to the patient's satisfaction.     Ruta Hinds

## 2019-08-26 NOTE — Op Note (Signed)
Procedure: Abdominal aortogram with bilateral lower extremity runoff, aortic stent (VBX 11 x 29)  Preoperative diagnosis: Claudication  Postoperative diagnosis: Same  Anesthesia: Local with IV sedation  Operative findings: 90% stenosis distal aorta recurrent within pre-existing stent restented to 0% residual stenosis  Bilateral three-vessel runoff no significant outflow occlusive disease  Operative details: After obtaining form consent, the patient taken the PV lab.  The patient was placed in supine position angio table.  Both groins were prepped and draped in usual sterile fashion.  Local anesthesia was infiltrated over the right common femoral artery.  Ultrasound was used to identify the right common femoral artery and femoral bifurcation.  Next an introducer needle was used to cannulate the right common femoral artery an 035 Bentson wire advanced up in the abdominal aorta under fluoroscopic guidance.  Next a 5 French sheath placed over the guidewire in the right common femoral artery and this was thoroughly flushed with heparinized saline.  A 5 French pigtail catheter was advanced over the guidewire into the abdominal aorta and abdominal aortogram obtained in AP projection.  Left and right renal arteries are widely patent.  The infrarenal abdominal aorta is patent and there is a stent in the distal portion of the aorta.  This has a 90% stenosis in its distal portion.  The left and right common iliac arteries are patent.  The left and right external and internal iliac arteries are also patent.  Next bilateral lower extremity runoff views were obtained through the pigtail catheter.  There is no significant outflow stenosis in either lower extremity the right common femoral profunda femoris superficial femoral-popliteal and all 3 tibial vessels are patent.  Similar findings on the left side.  At this point we decided to intervene on the recurrent stenosis of the aortic stent.  The patient was given  8000 units of intravenous heparin.  She was then given an additional 3000 units of heparin when the ACT was initially 230.  Next the 5 French sheath was swapped out over a guidewire for a 8 [place] Brite tip sheath.  This was advanced up to the level of the aorta.  An 11 x 29 VBX stent was then centered with its distal portion right at the aortic bifurcation covering the entire narrowing within the pre-existing aortic stent.  This was then deployed to nominal pressure for 30 seconds.  The balloon was then removed.  Completion angiogram showed wide patency of the infrarenal abdominal aorta as well as the iliac system.  At this point the guidewire was pulled down.  Angiogram of the right common femoral artery was performed.  It showed we had plenty of room for Pro-glide.  And the 8 French sheath removed and a ProGlide device placed over the guidewire into the right common femoral system.  I fired the first in an AP position and there was not good hemostasis so an additional ProGlide was fired at about the 2 o'clock position and this achieved hemostasis when both were secured.  He direct pressure was also held and the patient was also given 25 mg of protamine.  The guidewire was removed after the second Pro-glide had been deployed.  Patient tolerated procedure well and there were no complications.  Patient was taken the holding area in stable condition.  Operative management: The patient will be maintained on aspirin and Plavix for 6 weeks.  Then at that point most likely we will go to aspirin alone.  Ruta Hinds, MD [facility]  Office: [phone]

## 2019-08-26 NOTE — Discharge Instructions (Signed)
Femoral Site Care This sheet gives you information about how to care for yourself after your procedure. Your health care provider may also give you more specific instructions. If you have problems or questions, contact your health care provider. What can I expect after the procedure?  After the procedure, it is common to have:  Bruising that usually fades within 1-2 weeks.  Tenderness at the site. Follow these instructions at home: Wound care 1. Follow instructions from your health care provider about how to take care of your insertion site. Make sure you: ? Wash your hands with soap and water before you change your bandage (dressing). If soap and water are not available, use hand sanitizer. ? Remove your dressing as told by your health care provider. 2. Do not take baths, swim, or use a hot tub until your health care provider approves. 3. You may shower 24-48 hours after the procedure or as told by your health care provider. ? Gently wash the site with plain soap and water. ? Pat the area dry with a clean towel. ? Do not rub the site. This may cause bleeding. 4. Do not apply powder or lotion to the site. Keep the site clean and dry. 5. Check your femoral site every day for signs of infection. Check for: ? Redness, swelling, or pain. ? Fluid or blood. ? Warmth. ? Pus or a bad smell. Activity 1. For the first 2-3 days after your procedure, or as long as directed: ? Avoid climbing stairs as much as possible. ? Do not squat. 2. Do not lift anything that is heavier than 10 lb (4.5 kg), or the limit that you are told, until your health care provider says that it is safe. For 5 days 3. Rest as directed. ? Avoid sitting for a long time without moving. Get up to take short walks every 1-2 hours. 4. Do not drive for 24 hours if you were given a medicine to help you relax (sedative). General instructions  Take over-the-counter and prescription medicines only as told by your health care  provider.  Keep all follow-up visits as told by your health care provider. This is important. Contact a health care provider if you have:  A fever or chills.  You have redness, swelling, or pain around your insertion site. Get help right away if:  The catheter insertion area swells very fast.  You pass out.  You suddenly start to sweat or your skin gets clammy.  The catheter insertion area is bleeding, and the bleeding does not stop when you hold steady pressure on the area.  The area near or just beyond the catheter insertion site becomes pale, cool, tingly, or numb. These symptoms may represent a serious problem that is an emergency. Do not wait to see if the symptoms will go away. Get medical help right away. Call your local emergency services (911 in the [place] Do not drive yourself to the hospital. Summary  After the procedure, it is common to have bruising that usually fades within 1-2 weeks.  Check your femoral site every day for signs of infection.  Do not lift anything that is heavier than 10 lb (4.5 kg), or the limit that you are told, until your health care provider says that it is safe. This information is not intended to replace advice given to you by your health care provider. Make sure you discuss any questions you have with your health care provider. Document Revised: 04/27/2017 Document Reviewed: 04/27/2017 Elsevier Patient Education    2020 Elsevier Inc.   

## 2019-09-01 ENCOUNTER — Telehealth (INDEPENDENT_AMBULATORY_CARE_PROVIDER_SITE_OTHER): Payer: Self-pay | Admitting: *Deleted

## 2019-09-01 NOTE — Telephone Encounter (Signed)
Patient called in - wants to know when she can be resch'd for flex sig since she had aortic stent put in on 08/26/19 - per op note: The patient will be maintained on aspirin and Plavix for 6 weeks.  Then at that point most likely we will go to aspirin alone - please advise when she can have FS

## 2019-09-12 NOTE — Telephone Encounter (Signed)
Flexible sigmoidoscopy can be scheduled when she comes off Plavix. She can continue aspirin

## 2019-09-12 NOTE — Telephone Encounter (Signed)
Left detailed message of Dr Olevia Perches recommendation, asked patient to call us once she stop Plavix and we can get Flex Sig

## 2019-09-13 ENCOUNTER — Other Ambulatory Visit: Payer: Self-pay | Admitting: *Deleted

## 2019-09-13 DIAGNOSIS — I739 Peripheral vascular disease, unspecified: Secondary | ICD-10-CM

## 2019-09-14 ENCOUNTER — Telehealth (HOSPITAL_COMMUNITY): Payer: Self-pay

## 2019-09-14 NOTE — Telephone Encounter (Signed)

## 2019-09-15 ENCOUNTER — Ambulatory Visit (INDEPENDENT_AMBULATORY_CARE_PROVIDER_SITE_OTHER): Payer: 59 | Admitting: Vascular Surgery

## 2019-09-15 ENCOUNTER — Other Ambulatory Visit: Payer: Self-pay

## 2019-09-15 ENCOUNTER — Encounter: Payer: Self-pay | Admitting: Vascular Surgery

## 2019-09-15 ENCOUNTER — Ambulatory Visit (HOSPITAL_COMMUNITY)
Admission: RE | Admit: 2019-09-15 | Discharge: 2019-09-15 | Disposition: A | Discharge status: Home / Self Care | Payer: 59 | Source: Ambulatory Visit | Attending: Vascular Surgery | Admitting: Vascular Surgery

## 2019-09-15 VITALS — BP 102/63 | HR 79 | Temp 97.3°F | Resp 20 | Ht 62.0 in | Wt 145.0 lb

## 2019-09-15 DIAGNOSIS — I739 Peripheral vascular disease, unspecified: Secondary | ICD-10-CM

## 2019-09-15 NOTE — Progress Notes (Signed)
Patient is a 54 year old female who returns for follow-up today after recent aortic stenting.  She reports that her claudication symptoms have completely resolved.  Unfortunately she still smoking half pack of cigarettes per day.  She was counseled against this again today.  She is on Plavix aspirin and a statin.  Review of systems: She has no shortness of breath.  She has no chest pain.  Physical exam:  Vitals:   09/15/19 1504  BP: 102/63  Pulse: 79  Resp: 20  Temp: (!) 97.3 F (36.3 C)  SpO2: 93%  Weight: 145 lb (65.8 kg)  Height: 5\' 2"  (1.575 m)    1+ left dorsalis pedis pulse 2+ right dorsalis pedis pulse absent posterior tibial pulses bilaterally  Data: Patient had bilateral ABIs done with and without exercise today.  She had no pressure drop with exercise.  ABIs were greater than 1 triphasic bilaterally.  Assessment: Doing well status post aortic stenting for occlusive disease.  She currently has no claudication symptoms.  She was counseled today that she will certainly develop restenosis down the road if she continues to smoke and she may be facing aortobifemoral bypass or potentially even limb loss.  Plan: She will follow-up in 3 months time with bilateral ABIs and an aortoiliac duplex.  She will be seen in our [facility] clinic  Ruta Hinds, MD [facility] [place] Office: [phone]

## 2019-09-19 ENCOUNTER — Other Ambulatory Visit: Payer: Self-pay | Admitting: *Deleted

## 2019-09-19 DIAGNOSIS — I739 Peripheral vascular disease, unspecified: Secondary | ICD-10-CM

## 2019-12-12 ENCOUNTER — Other Ambulatory Visit: Payer: Self-pay

## 2019-12-12 ENCOUNTER — Ambulatory Visit (INDEPENDENT_AMBULATORY_CARE_PROVIDER_SITE_OTHER)
Admission: RE | Admit: 2019-12-12 | Discharge: 2019-12-12 | Disposition: A | Discharge status: Home / Self Care | Payer: 59 | Source: Ambulatory Visit | Attending: Surgery | Admitting: Surgery

## 2019-12-12 ENCOUNTER — Ambulatory Visit (INDEPENDENT_AMBULATORY_CARE_PROVIDER_SITE_OTHER): Payer: 59 | Admitting: Physician Assistant

## 2019-12-12 ENCOUNTER — Ambulatory Visit (HOSPITAL_COMMUNITY)
Admission: RE | Admit: 2019-12-12 | Discharge: 2019-12-12 | Disposition: A | Discharge status: Home / Self Care | Payer: 59 | Source: Ambulatory Visit | Attending: Surgery | Admitting: Surgery

## 2019-12-12 VITALS — BP 121/75 | HR 67 | Temp 98.0°F | Resp 20 | Ht 62.0 in | Wt 150.0 lb

## 2019-12-12 DIAGNOSIS — I739 Peripheral vascular disease, unspecified: Secondary | ICD-10-CM | POA: Insufficient documentation

## 2019-12-12 NOTE — Progress Notes (Signed)
Office Note     CC:  follow up Requesting Provider:  Curlene Labrum, MD  HPI: Pamela Pham is a 54 y.o. (1965-09-30) female who presents for follow up s/p Abdominal aortogram with bilateral lower extremity runoff and Aortic stenting with VBX on 08/26/19 by Dr. Oneida Alar for bilateral lower extremity claudication. Her claudication symptoms remain resolved. She has no rest pain or non healing wounds. She does continue to smoke but states that 1 pack will last her all week now. She is otherwise compliant with her Aspirin, Statin and Plavix  The pt is on a statin for cholesterol management.  The pt is on a daily aspirin.   Other AC: Plavix The pt is not on medication for hypertension.   The pt is not diabetic.  Tobacco hx:  Current smoker, 1/2 ppd  Past Medical History:  Diagnosis Date  . Abnormal Pap smear of cervix   . Anxiety   . Asthma   . COPD (chronic obstructive pulmonary disease) ([place])   . Depression   . Dysmenorrhea   . HOH (hard of hearing)    right ear  . Hypothyroidism   . Migraines   . Substance abuse ([place])    recovering addict  . Thyroid disease     Past Surgical History:  Procedure Laterality Date  . ABDOMINAL AORTOGRAM W/LOWER EXTREMITY Bilateral 08/26/2019   Procedure: ABDOMINAL AORTOGRAM W/LOWER EXTREMITY;  Surgeon: Elam Dutch, MD;  Location: [facility];  Service: Cardiovascular;  Laterality: Bilateral;  . ABDOMINAL HYSTERECTOMY    . AORTIC INTERVENTION N/A 08/26/2019   Procedure: AORTIC INTERVENTION;  Surgeon: Elam Dutch, MD;  Location: [facility];  Service: Cardiovascular;  Laterality: N/A;  distal aortic stent  . CERVICAL BIOPSY  W/ LOOP ELECTRODE EXCISION     in pts 20's  . CESAREAN SECTION  2003  . CHOLECYSTECTOMY     in late 20's  . COLONOSCOPY WITH PROPOFOL N/A 12/04/2017   Procedure: COLONOSCOPY WITH PROPOFOL;  Surgeon: Rogene Houston, MD;  [facility];  Service: Endoscopy;  Laterality: N/A;  1:35-moved to  1235 per Lelon Frohlich - office unable to reach patient to move them up  . CYSTOSCOPY N/A 12/19/2013   Procedure: CYSTOSCOPY;  Surgeon: Lyman Speller, MD;  Location: [facility];  Service: Gynecology;  Laterality: N/A;  . FOREIGN BODY REMOVAL Right 02/02/2013   Procedure: Exploration right forearm with removal foreign body;  Surgeon: Schuyler Amor, MD;  Location: [facility];  Service: Orthopedics;  Laterality: Right;  Foreign body given to patient.  Marland Kitchen ILIAC ARTERY STENT     age 67, congenital stricture of vessel  . POLYPECTOMY  12/04/2017   Procedure: POLYPECTOMY;  Surgeon: Rogene Houston, MD;  [facility];  Service: Endoscopy;;  colon  . ROBOTIC ASSISTED TOTAL HYSTERECTOMY N/A 12/19/2013   Procedure: ROBOTIC ASSISTED TOTAL HYSTERECTOMY with bilateral salpingectomy .;  Surgeon: Lyman Speller, MD;  Location: [facility];  Service: Gynecology;  Laterality: N/A;    Social History   Socioeconomic History  . Marital status: Single    Spouse name: Not on file  . Number of children: Not on file  . Years of education: Not on file  . Highest education level: Not on file  Occupational History  . Not on file  Tobacco Use  . Smoking status: Current Every Day Smoker    Packs/day: 0.50    Types: Cigarettes  . Smokeless tobacco: Never Used  Vaping  Use  . Vaping Use: Never used  Substance and Sexual Activity  . Alcohol use: Not Currently  . Drug use: Never    Types: "Crack" cocaine  . Sexual activity: Yes    Partners: Male    Birth control/protection: Surgical    Comment: hysterectomy  Other Topics Concern  . Not on file  Social History Narrative   ** Merged History Encounter **       ** Merged History Encounter **       Social Determinants of Health   Financial Resource Strain:   . Difficulty of Paying Living Expenses:   Food Insecurity:   . Worried About Charity fundraiser in the Last Year:   . Arboriculturist in the Last Year:   Transportation Needs:   .  Film/video editor (Medical):   Marland Kitchen Lack of Transportation (Non-Medical):   Physical Activity:   . Days of Exercise per Week:   . Minutes of Exercise per Session:   Stress:   . Feeling of Stress :   Social Connections:   . Frequency of Communication with Friends and Family:   . Frequency of Social Gatherings with Friends and Family:   . Attends Religious Services:   . Active Member of Clubs or Organizations:   . Attends Archivist Meetings:   Marland Kitchen Marital Status:   Intimate Partner Violence:   . Fear of Current or Ex-Partner:   . Emotionally Abused:   Marland Kitchen Physically Abused:   . Sexually Abused:     Family History  Problem Relation Age of Onset  . Cancer Maternal Grandmother        leukemia  . Diabetes Maternal Grandmother   . Stroke Maternal Grandmother   . Heart attack Mother   . Hypertension Maternal Grandfather   . Thyroid disease Paternal Grandmother   . Cancer Paternal Uncle     Current Outpatient Medications  Medication Sig Dispense Refill  . albuterol (PROAIR HFA) 108 (90 Base) MCG/ACT inhaler Inhale 2 puffs into the lungs every 6 (six) hours as needed for wheezing or shortness of breath.     Marland Kitchen aspirin EC 81 MG tablet Take 81 mg by mouth daily.    Marland Kitchen atorvastatin (LIPITOR) 40 MG tablet Take 40 mg by mouth at bedtime.     . cholecalciferol (VITAMIN D3) 25 MCG (1000 UNIT) tablet Take 1,000 Units by mouth daily.    . clonazePAM (KLONOPIN) 0.5 MG tablet Take 0.5 mg by mouth every evening.     . clopidogrel (PLAVIX) 75 MG tablet Take 1 tablet (75 mg total) by mouth daily. 30 tablet 11  . dicyclomine (BENTYL) 10 MG capsule Take 1 capsule (10 mg total) by mouth 3 (three) times daily as needed for spasms. 60 capsule 2  . FLUoxetine (PROZAC) 10 MG tablet Take 10 mg by mouth daily.    Marland Kitchen levothyroxine (SYNTHROID) 125 MCG tablet Take 125 mcg by mouth daily before breakfast.     . mirtazapine (REMERON) 45 MG tablet Take 45 mg by mouth at bedtime.   0  . Multiple  Vitamins-Minerals (MULTIVITAMIN WITH MINERALS) tablet Take 1 tablet by mouth daily.    Marland Kitchen omeprazole (PRILOSEC) 20 MG capsule Take 20 mg by mouth daily.    Marland Kitchen SPIRIVA HANDIHALER 18 MCG inhalation capsule Place 18 mcg into inhaler and inhale daily.     . Wheat Dextrin (BENEFIBER DRINK MIX) PACK Take 4 g by mouth at bedtime.     No current  facility-administered medications for this visit.    Allergies  Allergen Reactions  . Augmentin [Amoxicillin-Pot Clavulanate] Nausea Only  . Codeine Nausea And Vomiting  . Sulfa Antibiotics Nausea And Vomiting  . Sulfa Antibiotics Nausea Only  . Sulfa Antibiotics      REVIEW OF SYSTEMS:  [X]  denotes positive finding, [ ]  denotes negative finding Cardiac  Comments:  Chest pain or chest pressure:    Shortness of breath upon exertion:    Short of breath when lying flat:    Irregular heart rhythm:        Vascular    Pain in calf, thigh, or hip brought on by ambulation:    Pain in feet at night that wakes you up from your sleep:     Blood clot in your veins:    Leg swelling:         Pulmonary    Oxygen at home:    Productive cough:     Wheezing:         Neurologic    Sudden weakness in arms or legs:     Sudden numbness in arms or legs:     Sudden onset of difficulty speaking or slurred speech:    Temporary loss of vision in one eye:     Problems with dizziness:         Gastrointestinal    Blood in stool:     Vomited blood:         Genitourinary    Burning when urinating:     Blood in urine:        Psychiatric    Major depression:         Hematologic    Bleeding problems:    Problems with blood clotting too easily:        Skin    Rashes or ulcers:        Constitutional    Fever or chills:      PHYSICAL EXAMINATION:  Vitals:   12/12/19 1006  BP: 121/75  Pulse: 67  Resp: 20  Temp: 98 F (36.7 C)  TempSrc: Temporal  SpO2: 99%  Weight: 150 lb (68 kg)  Height: 5\' 2"  (1.575 m)    General:  WDWN in NAD; vital signs  documented above Gait: Normal HENT: WNL, normocephalic Pulmonary: normal non-labored breathing , without wheezing Cardiac: regular HR, without  Murmurs without carotid bruit Abdomen: soft, NT, no masses Vascular Exam/Pulses: 2+ femoral pulses, 2+ right DP, 1+ left DP, not able to palpate PT bilaterally. Bilateral feet warm and well perfused Extremities: without ischemic changes, without Gangrene , without cellulitis; without open wounds;  Musculoskeletal: no muscle wasting or atrophy  Neurologic: A&O X 3;  No focal weakness or paresthesias are detected Psychiatric:  The pt has Normal affect.   Non-Invasive Vascular Imaging:   Abdominal Aorta Findings:  +----------+-------+----------+----------+---------+--------+--------+  Location [facility] (cm)Trans (cm)PSV (cm/s)Waveform ThrombusComments  +----------+-------+----------+----------+---------+--------+--------+  Proximal 1.70  1.65   71    triphasic          +----------+-------+----------+----------+---------+--------+--------+  LT CIA Mid         117    triphasic          +----------+-------+----------+----------+---------+--------+--------+   Patent distal aortic stent with no evidence of restenosis.     Right Stent(s):  +--------------+--------+--------+---------+--------+  Common iliac PSV cm/sStenosisWaveform Comments  +--------------+--------+--------+---------+--------+  Prox to Stent 122       biphasic       +--------------+--------+--------+---------+--------+  Proximal [id number]  biphasic       +--------------+--------+--------+---------+--------+  Mid Stent   117       biphasic       +--------------+--------+--------+---------+--------+  Distal Stent 123       triphasic      +--------------+--------+--------+---------+--------+   Patent with no evidence of restenosis.    Aortic  Stent(s):  +---------------+--------+--------+---------+--------+  distal aorta  PSV cm/sStenosisWaveform Comments  +---------------+--------+--------+---------+--------+  Prox to Stent 85       biphasic       +---------------+--------+--------+---------+--------+  Proximal Stent 127       biphasic       +---------------+--------+--------+---------+--------+  Mid Stent   122       triphasic      +---------------+--------+--------+---------+--------+  Distal Stent  134       triphasic      +---------------+--------+--------+---------+--------+  Distal to [id number]       triphasic      +---------------+--------+--------+---------+--------+    +-------+-----------+-----------+------------+------------+  ABI/TBIToday's ABIToday's TBIPrevious ABIPrevious TBI  +-------+-----------+-----------+------------+------------+  Right 1.19    0.58    1.19    0.39      +-------+-----------+-----------+------------+------------+  Left  1.08    0.75    1.12    0.64      +-------+-----------+-----------+------------+------------+     ASSESSMENT/PLAN:: 55 y.o. female here for follow up for her aortic stenting for aortic stenosis with bilateral lower extremity claudication. Her ABI's are essentially unchanged from prior study and her duplex shows patent Aortic stent. She remains without claudication, rest pain or non healing wounds. She is complaining of easy bruising and bleeding with being on Plavix. She has been on it now over 4 months. I recommend she complete this months prescription and feel that it is reasonable to just continue with her Aspirin and statin after that. Re- discussed importance of smoking cessation.   -She will follow up in 6 months with bilateral ABIs and Aortoiliac duplex   Karoline Caldwell, PA-C [facility] [phone]  Clinic MD:  Dr. Trula Slade

## 2019-12-14 ENCOUNTER — Other Ambulatory Visit: Payer: Self-pay | Admitting: *Deleted

## 2019-12-14 DIAGNOSIS — I714 Abdominal aortic aneurysm, without rupture, unspecified: Secondary | ICD-10-CM

## 2019-12-14 DIAGNOSIS — I739 Peripheral vascular disease, unspecified: Secondary | ICD-10-CM

## 2020-07-19 ENCOUNTER — Ambulatory Visit (HOSPITAL_BASED_OUTPATIENT_CLINIC_OR_DEPARTMENT_OTHER): Payer: 59 | Admitting: Obstetrics & Gynecology

## 2020-08-15 ENCOUNTER — Telehealth (INDEPENDENT_AMBULATORY_CARE_PROVIDER_SITE_OTHER): Payer: Self-pay | Admitting: Internal Medicine

## 2020-08-15 NOTE — Telephone Encounter (Signed)
Patient called the office stated she needs a refill on dicyclomine - sent to [facility] - please advise - ph# [phone]

## 2020-08-16 ENCOUNTER — Other Ambulatory Visit (INDEPENDENT_AMBULATORY_CARE_PROVIDER_SITE_OTHER): Payer: Self-pay

## 2020-08-16 MED ORDER — DICYCLOMINE HCL 10 MG PO CAPS: 10.0000 mg | ORAL_CAPSULE | Freq: Three times a day (TID) | ORAL | 1 refills | Status: DC | PRN

## 2020-08-16 NOTE — Telephone Encounter (Signed)
I called and left a vm that we have sent in the patient's medication to the requested pharmacy. Patient already has a follow up visit scheduled here.

## 2020-10-19 ENCOUNTER — Telehealth (HOSPITAL_BASED_OUTPATIENT_CLINIC_OR_DEPARTMENT_OTHER): Payer: Self-pay

## 2020-10-19 NOTE — Telephone Encounter (Signed)
Attempt made to contact Pamela Pham is a 55 y.o. female re: New pt Pre appt call to collect history information. -Allergy -Medication -Confirm pharmacy -OB history   Pt was not available. LM on the VM for the patient to call back

## 2020-10-22 ENCOUNTER — Ambulatory Visit (HOSPITAL_BASED_OUTPATIENT_CLINIC_OR_DEPARTMENT_OTHER): Payer: Self-pay | Admitting: Obstetrics & Gynecology

## 2020-12-25 ENCOUNTER — Other Ambulatory Visit: Payer: Self-pay

## 2020-12-25 ENCOUNTER — Ambulatory Visit (INDEPENDENT_AMBULATORY_CARE_PROVIDER_SITE_OTHER): Payer: Self-pay | Admitting: Internal Medicine

## 2020-12-25 ENCOUNTER — Encounter (INDEPENDENT_AMBULATORY_CARE_PROVIDER_SITE_OTHER): Payer: Self-pay | Admitting: Internal Medicine

## 2020-12-25 VITALS — BP 112/75 | HR 75 | Temp 98.8°F | Ht 62.0 in | Wt 148.0 lb

## 2020-12-25 DIAGNOSIS — K589 Irritable bowel syndrome without diarrhea: Secondary | ICD-10-CM | POA: Insufficient documentation

## 2020-12-25 DIAGNOSIS — K219 Gastro-esophageal reflux disease without esophagitis: Secondary | ICD-10-CM

## 2020-12-25 DIAGNOSIS — R935 Abnormal findings on diagnostic imaging of other abdominal regions, including retroperitoneum: Secondary | ICD-10-CM

## 2020-12-25 DIAGNOSIS — K582 Mixed irritable bowel syndrome: Secondary | ICD-10-CM

## 2020-12-25 MED ORDER — DICYCLOMINE HCL 10 MG PO CAPS: 10.0000 mg | ORAL_CAPSULE | Freq: Three times a day (TID) | ORAL | 5 refills | Status: DC | PRN

## 2020-12-25 MED ORDER — OMEPRAZOLE 20 MG PO CPDR: 20.0000 mg | DELAYED_RELEASE_CAPSULE | Freq: Every day | ORAL | 3 refills | Status: DC

## 2020-12-25 MED ORDER — METAMUCIL SMOOTH TEXTURE 58.6 % PO POWD
1.0000 | Freq: Every day | ORAL | Status: DC
Start: 2020-12-25 — End: 2023-02-17

## 2020-12-25 NOTE — Progress Notes (Signed)
etamucilPresenting complaint;  Follow-up for IBS and GERD. History of sigmoid colon diverticulitis.  Database and subjective:  Patient is 55 year old Caucasian female who has a history of sigmoid diverticulitis, IBS and GERD who is here for scheduled visit.    She was seen in emergency room  at [facility] on 5 04/16/2018 for abdominal pain.  Abdominal pelvic CT revealed acute diverticulitis involving sigmoid colon and 2.8 cm low-density mass felt to be phlegmon.  She was treated with antibiotics and improved.  Her last colonoscopy was in August 2019 with removal of 6 mm tubular adenoma from sigmoid colon.  She also had diverticula at transverse and sigmoid colon as well as external hemorrhoids.  She had virtual visit with me on 08/10/2018 and was doing well.  She was advised to take Benefiber daily along with Colace.  Patient had CT angio of aorta and bifemoral's in March 2021 which revealed abnormal segment of sigmoid colon.  She was having ischemic pain in her right toe.  I recommended proceeding with therapy for vascular disease and return for reevaluation.  She had 90% stenosis to distal aorta within pre-existing stent which was restented with resolution of her toe pain. Patient decided not to come back because she lost insurance in the meantime. Now she is working has an Insurance underwriter and therefore decided to come back for follow-up.  She complains of daily heartburn.  She denies dysphagia or throat symptoms.  She has intermittent nausea and sporadic vomiting but she has not vomited in over a month.  Her heartburn returned since she ran out of her omeprazole prescription. She remains with irregular bowel movements.  She had diarrhea yesterday.  She states she started a new job and work to the facility without a condition.  She passed watery stool.  She can also go 2 to 3 days without a bowel movement and she has urgency and cramping relieved with defecation and dicyclomine.  She is not having any side  effects with dicyclomine.  She denies melena or rectal bleeding. She is trying to be more active.  She says she walks at least 2 to 3 miles a week.  She is still smoking but doing her best to stop smoking she is smoking less than half a pack per day. She has not been treated for diverticulitis since her episode over 3 years ago.   Current Medications: Outpatient Encounter Medications as of 12/25/2020  Medication Sig   albuterol (VENTOLIN HFA) 108 (90 Base) MCG/ACT inhaler Inhale 2 puffs into the lungs every 6 (six) hours as needed for wheezing or shortness of breath.    aspirin EC 81 MG tablet Take 81 mg by mouth daily.   atorvastatin (LIPITOR) 40 MG tablet Take 40 mg by mouth at bedtime.    clonazePAM (KLONOPIN) 0.5 MG tablet Take 0.5 mg by mouth every evening.    dicyclomine (BENTYL) 10 MG capsule Take 1 capsule (10 mg total) by mouth 3 (three) times daily as needed for spasms.   FLUoxetine (PROZAC) 10 MG tablet Take 10 mg by mouth daily.   levothyroxine (SYNTHROID) 125 MCG tablet Take 125 mcg by mouth daily before breakfast.    mirtazapine (REMERON) 45 MG tablet Take 45 mg by mouth at bedtime.    Multiple Vitamins-Minerals (MULTIVITAMIN WITH MINERALS) tablet Take 1 tablet by mouth daily.   SPIRIVA HANDIHALER 18 MCG inhalation capsule Place 18 mcg into inhaler and inhale daily.    [DISCONTINUED] cholecalciferol (VITAMIN D3) 25 MCG (1000 UNIT) tablet Take 1,000 Units  by mouth daily.   clopidogrel (PLAVIX) 75 MG tablet Take 1 tablet (75 mg total) by mouth daily. (Patient not taking: Reported on 12/25/2020)   omeprazole (PRILOSEC) 20 MG capsule Take 20 mg by mouth daily. (Patient not taking: Reported on 12/25/2020)   Wheat Dextrin (BENEFIBER DRINK MIX) PACK Take 4 g by mouth at bedtime. (Patient not taking: Reported on 12/25/2020)   No facility-administered encounter medications on file as of 12/25/2020.     Objective: Blood pressure 112/75, pulse 75, temperature 98.8 F (37.1 C), temperature  source Oral, height $RemoveBefo'5\' 2"'[id number]$  (1.575 m), weight 148 lb (67.1 kg), last menstrual period 12/02/2013. Patient is alert and in no acute distress. Conjunctiva is pink. Sclera is nonicteric Oropharyngeal mucosa is normal. No neck masses or thyromegaly noted. Cardiac exam with regular rhythm normal S1 and S2. No murmur or gallop noted. Lungs are clear to auscultation. Abdomen is symmetrical.  Bowel sounds are normal.  No bruit noted.  On palpation abdomen is soft.  She has mild tenderness in LLQ without guarding. No LE edema or clubbing noted.  Labs/studies Results:   CBC Latest Ref Rng & Units 08/26/2019 12/01/2017 09/08/2017  WBC 4.0 - 10.5 K/uL - 7.5 9.0  Hemoglobin 12.0 - 15.0 g/dL 14.3 14.3 10.9(L)  Hematocrit 36.0 - 46.0 % 42.0 42.2 33.4(L)  Platelets 150 - 400 K/uL - 217 292    CMP Latest Ref Rng & Units 08/26/2019 12/01/2017 09/08/2017  Glucose 70 - 99 mg/dL 88 107(H) 90  BUN 6 - 20 mg/dL $Remove'10 11 6  'DqzQXdV$ Creatinine 0.44 - 1.00 mg/dL 0.90 0.79 0.76  Sodium 135 - 145 mmol/L 142 141 139  Potassium 3.5 - 5.1 mmol/L 4.3 3.7 3.9  Chloride 98 - 111 mmol/L 103 108 107  CO2 22 - 32 mmol/L - 27 26  Calcium 8.9 - 10.3 mg/dL - 9.2 8.1(L)  Total Protein 6.5 - 8.1 g/dL - - -  Total Bilirubin 0.3 - 1.2 mg/dL - - -  Alkaline Phos 38 - 126 U/L - - -  AST 15 - 41 U/L - - -  ALT 14 - 54 U/L - - -    Hepatic Function Latest Ref Rng & Units 09/06/2017  Total Protein 6.5 - 8.1 g/dL 7.2  Albumin 3.5 - 5.0 g/dL 3.5  AST 15 - 41 U/L 13(L)  ALT 14 - 54 U/L 15  Alk Phosphatase 38 - 126 U/L 87  Total Bilirubin 0.3 - 1.2 mg/dL 0.6    No recent lab data on file.  Assessment:  #1.  Chronic GERD.  She is having daily heartburn off therapy.  She needs to go back on PPI.  She is watching her diet.  Smoking cessation would also help.  She does not have any alarm symptoms.  #2.  Irritable bowel syndrome.  Her symptoms are typical.  She has diarrhea as well as constipation.  She is getting relief with dicyclomine that she  using on as-needed basis.  She is not taking Benefiber.  I believe she may do better with water insoluble fiber.  #3.  History of colonic adenoma.  Last colonoscopy was in August 2019 and next colonoscopy would be in August 2024.  #4.  History of diverticulitis.  She had what was felt to be phlegmon back in May 2019 when she had sigmoid diverticulitis.  She had CT angio aorta in March last year which revealed thickening to segment of sigmoid colon but no mass was seen.  She is not having any  symptoms of diverticulitis but I feel follow-up CT is indicated to reevaluate the segment to rule out occult neoplasm.   Plan:  Abdominopelvic CT with contrast. Antireflux measures reinforced. Metamucil 3 to 4 g by mouth daily at bedtime. New prescription given for dicyclomine 10 mg p.o. 3 times daily as needed 90 doses with 5 refills. New prescription given for omeprazole 20 mg p.o. every morning 90 doses with 3 refills. Office visit in 6 months.

## 2020-12-25 NOTE — Patient Instructions (Signed)
Antireflux measures. Take Metamucil 3 to 4 g by mouth daily at bedtime. Physician will call with results of CT when completed.

## 2021-05-13 ENCOUNTER — Ambulatory Visit (HOSPITAL_BASED_OUTPATIENT_CLINIC_OR_DEPARTMENT_OTHER): Payer: BLUE CROSS/BLUE SHIELD | Admitting: Obstetrics & Gynecology

## 2021-05-13 ENCOUNTER — Other Ambulatory Visit (HOSPITAL_COMMUNITY)
Admission: RE | Admit: 2021-05-13 | Discharge: 2021-05-13 | Disposition: A | Discharge status: Home / Self Care | Payer: BLUE CROSS/BLUE SHIELD | Source: Ambulatory Visit | Attending: Obstetrics & Gynecology | Admitting: Obstetrics & Gynecology

## 2021-05-13 ENCOUNTER — Encounter (HOSPITAL_BASED_OUTPATIENT_CLINIC_OR_DEPARTMENT_OTHER): Payer: Self-pay | Admitting: Obstetrics & Gynecology

## 2021-05-13 ENCOUNTER — Other Ambulatory Visit: Payer: Self-pay

## 2021-05-13 VITALS — BP 103/74 | HR 73 | Ht 62.25 in | Wt 154.4 lb

## 2021-05-13 DIAGNOSIS — N871 Moderate cervical dysplasia: Secondary | ICD-10-CM | POA: Insufficient documentation

## 2021-05-13 DIAGNOSIS — Z720 Tobacco use: Secondary | ICD-10-CM | POA: Diagnosis not present

## 2021-05-13 DIAGNOSIS — Z780 Asymptomatic menopausal state: Secondary | ICD-10-CM | POA: Diagnosis not present

## 2021-05-13 DIAGNOSIS — R232 Flushing: Secondary | ICD-10-CM | POA: Diagnosis not present

## 2021-05-13 DIAGNOSIS — Z124 Encounter for screening for malignant neoplasm of cervix: Secondary | ICD-10-CM | POA: Diagnosis present

## 2021-05-13 DIAGNOSIS — Z01419 Encounter for gynecological examination (general) (routine) without abnormal findings: Secondary | ICD-10-CM

## 2021-05-13 DIAGNOSIS — Z1231 Encounter for screening mammogram for malignant neoplasm of breast: Secondary | ICD-10-CM

## 2021-05-13 DIAGNOSIS — Z90710 Acquired absence of both cervix and uterus: Secondary | ICD-10-CM

## 2021-05-13 NOTE — Progress Notes (Addendum)
56 y.o. Pamela Pham Single White or Caucasian female here for annual exam.  Doing well.  Just finished phlebotomy training.  Working prn at [employer].  Prior patient.  Last seen in 2017.  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  Has not had pap since 2017.    Patient's last menstrual period was 12/02/2013.          Sexually active: Yes.    The current method of family planning is status post hysterectomy.    Smoker:  yes  Health Maintenance: Pap:  04/25/2016 History of abnormal Pap:  h/o CIN 2 with hysterectomy MMG:  12/01/2018 Negative Colonoscopy:  12/04/2017 BMD:   guidelines reviewed Screening Labs: lab work will be done with PCP   reports that she has been smoking cigarettes. She has been smoking an average of .5 packs per day. She has never used smokeless tobacco. She reports that she does not currently use alcohol. She reports that she does not use drugs.  Past Medical History:  Diagnosis Date   Abnormal Pap smear of cervix    Anxiety    Asthma    COPD (chronic obstructive pulmonary disease) (HCC)    Depression    Dysmenorrhea    HOH (hard of hearing)    right ear   Hypothyroidism    Migraines    Substance abuse ([place])    recovering addict   Thyroid disease     Past Surgical History:  Procedure Laterality Date   ABDOMINAL AORTOGRAM W/LOWER EXTREMITY Bilateral 08/26/2019   Procedure: ABDOMINAL AORTOGRAM W/LOWER EXTREMITY;  Surgeon: Elam Dutch, MD;  Location: [facility];  Service: Cardiovascular;  Laterality: Bilateral;   AORTIC INTERVENTION N/A 08/26/2019   Procedure: AORTIC INTERVENTION;  Surgeon: Elam Dutch, MD;  Location: [facility];  Service: Cardiovascular;  Laterality: N/A;  distal aortic stent   CERVICAL BIOPSY  W/ LOOP ELECTRODE EXCISION     in pts 20's   CESAREAN SECTION  04/28/2001   CHOLECYSTECTOMY     in late 20's   COLONOSCOPY WITH PROPOFOL N/A 12/04/2017   Procedure: COLONOSCOPY WITH PROPOFOL;  Surgeon: Rogene Houston, MD;  [facility];  Service: Endoscopy;  Laterality: N/A;  1:35-moved to 1235 per Lelon Frohlich - office unable to reach patient to move them up   CYSTOSCOPY N/A 12/19/2013   Procedure: CYSTOSCOPY;  Surgeon: Lyman Speller, MD;  Location: [facility];  Service: Gynecology;  Laterality: N/A;   FOREIGN BODY REMOVAL Right 02/02/2013   Procedure: Exploration right forearm with removal foreign body;  Surgeon: Schuyler Amor, MD;  Location: [facility];  Service: Orthopedics;  Laterality: Right;  Foreign body given to patient.   ILIAC ARTERY STENT     age 31, congenital stricture of vessel   POLYPECTOMY  12/04/2017   Procedure: POLYPECTOMY;  Surgeon: Rogene Houston, MD;  [facility];  Service: Endoscopy;;  colon   ROBOTIC ASSISTED TOTAL HYSTERECTOMY N/A 12/19/2013   Procedure: ROBOTIC ASSISTED TOTAL HYSTERECTOMY with bilateral salpingectomy .;  Surgeon: Lyman Speller, MD;  Location: [facility];  Service: Gynecology;  Laterality: N/A;    Current Outpatient Medications  Medication Sig Dispense Refill   albuterol (VENTOLIN HFA) 108 (90 Base) MCG/ACT inhaler Inhale 2 puffs into the lungs every 6 (six) hours as needed for wheezing or shortness of breath.      aspirin EC 81 MG tablet Take 81 mg by mouth daily.     atorvastatin (  LIPITOR) 40 MG tablet Take 40 mg by mouth at bedtime.      clonazePAM (KLONOPIN) 0.5 MG tablet Take 0.5 mg by mouth every evening.      dicyclomine (BENTYL) 10 MG capsule Take 1 capsule (10 mg total) by mouth 3 (three) times daily as needed for spasms. 90 capsule 5   FLUoxetine (PROZAC) 10 MG tablet Take 10 mg by mouth daily.     levothyroxine (SYNTHROID) 125 MCG tablet Take 125 mcg by mouth daily before breakfast.      mirtazapine (REMERON) 45 MG tablet Take 45 mg by mouth at bedtime.   0   Multiple Vitamins-Minerals (MULTIVITAMIN WITH MINERALS) tablet Take 1 tablet by mouth daily.     omeprazole (PRILOSEC) 20 MG capsule Take 1 capsule  (20 mg total) by mouth daily before breakfast. 90 capsule 3   psyllium (METAMUCIL SMOOTH TEXTURE) 58.6 % powder Take 1 packet by mouth at bedtime.     SPIRIVA HANDIHALER 18 MCG inhalation capsule Place 18 mcg into inhaler and inhale daily.      No current facility-administered medications for this visit.    Family History  Problem Relation Age of Onset   Cancer Maternal Grandmother        leukemia   Diabetes Maternal Grandmother    Stroke Maternal Grandmother    Heart attack Mother    Hypertension Maternal Grandfather    Thyroid disease Paternal Grandmother    Cancer Paternal Uncle     Review of Systems  All other systems reviewed and are negative.  Exam:   BP 103/74 (BP Location: Right Arm, Patient Position: Sitting, Cuff Size: Large)    Pulse 73    Ht 5' 2.25" (1.581 m)    Wt 154 lb 6.4 oz (70 kg)    LMP 12/02/2013    BMI 28.01 kg/m   Height: 5' 2.25" (158.1 cm)  General appearance: alert, cooperative and appears stated age Head: Normocephalic, without obvious abnormality, atraumatic Neck: no adenopathy, supple, symmetrical, trachea midline and thyroid normal to inspection and palpation Lungs: wheezing noted bilaterally Breasts: normal appearance, no masses or tenderness Heart: regular rate and rhythm Abdomen: soft, non-tender; bowel sounds normal; no masses,  no organomegaly Extremities: extremities normal, atraumatic, no cyanosis or edema Skin: Skin color, texture, turgor normal. No rashes or lesions Lymph nodes: Cervical, supraclavicular, and axillary nodes normal. No abnormal inguinal nodes palpated Neurologic: Grossly normal   Pelvic: External genitalia:  no lesions              Urethra:  normal appearing urethra with no masses, tenderness or lesions              Bartholins and Skenes: normal                 Vagina: normal appearing vagina with normal color and no discharge, no lesions              Cervix: absent              Pap taken: Yes.   Bimanual Exam:   Uterus:  uterus absent              Adnexa: no mass, fullness, tenderness               Rectovaginal: Confirms               Anus:  normal sphincter tone, no lesions  Chaperone, Octaviano Batty, CMA, was present for exam.  Assessment/Plan: 1. Well woman  exam with routine gynecological exam - pap and HR HPV obtained today - order for MMG placed - colonoscopy up to date. Follow up 5 years. - pt needs to do lab work with PCP - vaccines updated today  2. Postmenopausal - no HRT  3. Hot flashes  4. Tobacco abuse  5. Encounter for screening mammogram for malignant neoplasm of breast - MM 3D SCREEN BREAST BILATERAL; Future  6. Dysplasia of cervix, high grade CIN 2 - Cytology - PAP( [facility])  7. History of robot-assisted laparoscopic hysterectomy

## 2021-05-15 LAB — CYTOLOGY - PAP
Comment: NEGATIVE
Diagnosis: NEGATIVE
High risk HPV: NEGATIVE

## 2021-05-16 ENCOUNTER — Other Ambulatory Visit (HOSPITAL_BASED_OUTPATIENT_CLINIC_OR_DEPARTMENT_OTHER): Payer: Self-pay | Admitting: *Deleted

## 2021-05-16 ENCOUNTER — Encounter (HOSPITAL_BASED_OUTPATIENT_CLINIC_OR_DEPARTMENT_OTHER): Payer: Self-pay | Admitting: *Deleted

## 2021-05-16 DIAGNOSIS — Z1231 Encounter for screening mammogram for malignant neoplasm of breast: Secondary | ICD-10-CM

## 2021-06-25 ENCOUNTER — Ambulatory Visit (INDEPENDENT_AMBULATORY_CARE_PROVIDER_SITE_OTHER): Payer: Self-pay | Admitting: Internal Medicine

## 2021-06-25 ENCOUNTER — Encounter (INDEPENDENT_AMBULATORY_CARE_PROVIDER_SITE_OTHER): Payer: Self-pay | Admitting: Internal Medicine

## 2021-06-25 ENCOUNTER — Other Ambulatory Visit: Payer: Self-pay

## 2021-06-25 VITALS — BP 100/46 | HR 75 | Temp 98.0°F | Ht 62.5 in | Wt 160.7 lb

## 2021-06-25 DIAGNOSIS — Z86010 Personal history of colonic polyps: Secondary | ICD-10-CM | POA: Insufficient documentation

## 2021-06-25 DIAGNOSIS — K582 Mixed irritable bowel syndrome: Secondary | ICD-10-CM

## 2021-06-25 DIAGNOSIS — Z8719 Personal history of other diseases of the digestive system: Secondary | ICD-10-CM | POA: Insufficient documentation

## 2021-06-25 DIAGNOSIS — K219 Gastro-esophageal reflux disease without esophagitis: Secondary | ICD-10-CM

## 2021-06-25 MED ORDER — POLYETHYLENE GLYCOL 3350 17 GM/SCOOP PO POWD: 8.5000 g | Freq: Every day | ORAL | 0 refills | Status: DC

## 2021-06-25 MED ORDER — DICYCLOMINE HCL 10 MG PO CAPS: 10.0000 mg | ORAL_CAPSULE | Freq: Two times a day (BID) | ORAL | 1 refills | Status: AC | PRN

## 2021-06-25 NOTE — Patient Instructions (Addendum)
Physician will call with results of blood test when completed. Dicyclomine dose not to exceed 20 mg/day.  Can take 10 mg before lunch and evening meal or use on as-needed basis. Start polyethylene glycol 8.5 g daily and can titrate dose or use it every other day. Continue Metamucil as before.

## 2021-06-25 NOTE — Progress Notes (Signed)
Presenting complaint;  Follow-up for IBS and GERD. History of diverticulitis.  Database and subjective:  Patient is a 56 year old Caucasian female who is here for scheduled visit.  She was last seen in August 2022. She has history of diverticulitis.  She has not had an episode in a couple of years.  She did have CT in March 2022 which did not reveal any changes of diverticulitis or or wall thickening.  Her last colonoscopy was in August 2019 revealing diverticula in sigmoid and transverse colon and she had 6 mm tubular adenoma removed removed.  She continues to complain of bloating.  Her bowels move every other day.  Sometimes she feels constipated and she is using milk of magnesia with good results.  She is still having intermittent explosive bowel movements.  Her appetite is good.  She says she has changed her eating habits completely.  She is watching calorie intake.  She does not understand why she is still gaining weight.  She has gained 6 pounds since her last visit. She she is using PPI on as-needed basis.  She feels heartburn is well controlled with dietary measures and as needed PPI.  She denies dysphagia. She is doing her best to quit cigarette smoking.  She is down to 6 or 7 cigarettes/day. She is working as a Charity fundraiser at [employer].  She says they have gym at work and she is going to use it.  Current Medications: Outpatient Encounter Medications as of 06/25/2021  Medication Sig   albuterol (VENTOLIN HFA) 108 (90 Base) MCG/ACT inhaler Inhale 2 puffs into the lungs every 6 (six) hours as needed for wheezing or shortness of breath.    aspirin EC 81 MG tablet Take 81 mg by mouth daily.   atorvastatin (LIPITOR) 40 MG tablet Take 40 mg by mouth at bedtime.    clonazePAM (KLONOPIN) 0.5 MG tablet Take 0.5 mg by mouth every evening.    dicyclomine (BENTYL) 10 MG capsule Take 1 capsule (10 mg total) by mouth 3 (three) times daily as needed for spasms.   FLUoxetine (PROZAC)  10 MG tablet Take 10 mg by mouth daily.   levothyroxine (SYNTHROID) 125 MCG tablet Take 125 mcg by mouth daily before breakfast.    mirtazapine (REMERON) 45 MG tablet Take 45 mg by mouth at bedtime.    Multiple Vitamins-Minerals (MULTIVITAMIN WITH MINERALS) tablet Take 1 tablet by mouth daily.   omeprazole (PRILOSEC) 20 MG capsule Take 1 capsule (20 mg total) by mouth daily before breakfast.   psyllium (METAMUCIL SMOOTH TEXTURE) 58.6 % powder Take 1 packet by mouth at bedtime.   SPIRIVA HANDIHALER 18 MCG inhalation capsule Place 18 mcg into inhaler and inhale daily.    No facility-administered encounter medications on file as of 06/25/2021.     Objective: Blood pressure (!) 100/46, pulse 75, temperature 98 F (36.7 C), temperature source Oral, height 5' 2.5" (1.588 m), weight 160 lb 11.2 oz (72.9 kg), last menstrual period 12/02/2013. Patient is alert and in no acute distress. Conjunctiva is pink. Sclera is nonicteric Oropharyngeal mucosa is normal. No neck masses or thyromegaly noted. Cardiac exam with regular rhythm normal S1 and S2. No murmur or gallop noted. Lungs are clear to auscultation. Abdomen is full.  Bowel sounds are normal.  On palpation abdomen is soft and nontender with organomegaly or masses. No LE edema or clubbing noted.   Assessment:  #1.  Irritable bowel syndrome.  Her symptoms are typical but she is still not having optimal response  to therapy.  She may consider using glycerin or Dulcolax suppository if she goes more than 1 day without a bowel movement in order to prevent constipation and/or explosive BM.  Need to screen her for celiac disease.  #2.  GERD.  Symptoms are well controlled with dietary measures and as needed low-dose omeprazole.  #3.  History of diverticulitis.  She was hospitalized in December 2019.  Last CT was in September 2022 did not reveal any wall thickening or changes of diverticulitis.  #4.  History of colonic adenoma.  She had 6 mm tubular  adenoma removed in August 2019.  She will be due for surveillance colonoscopy in August 2024   Plan:  Continue Metamucil as before. Decrease dicyclomine dose to 10 mg p.o. twice daily.  Can take it before lunch and evening meal. Polyethylene glycol 8.5 g by mouth daily at bedtime.  Can titrate the dose up or down or every other day depending on response. Celiac antibody panel. Office visit in 6 months.

## 2021-10-18 ENCOUNTER — Encounter (HOSPITAL_COMMUNITY): Payer: Self-pay | Admitting: Emergency Medicine

## 2021-10-18 ENCOUNTER — Emergency Department (HOSPITAL_COMMUNITY): Payer: BC Managed Care – PPO

## 2021-10-18 ENCOUNTER — Other Ambulatory Visit: Payer: Self-pay

## 2021-10-18 ENCOUNTER — Emergency Department (HOSPITAL_COMMUNITY)
Admission: EM | Admit: 2021-10-18 | Discharge: 2021-10-18 | Disposition: A | Discharge status: Home / Self Care | Payer: BC Managed Care – PPO | Attending: Emergency Medicine | Admitting: Emergency Medicine

## 2021-10-18 DIAGNOSIS — W01198A Fall on same level from slipping, tripping and stumbling with subsequent striking against other object, initial encounter: Secondary | ICD-10-CM | POA: Insufficient documentation

## 2021-10-18 DIAGNOSIS — J449 Chronic obstructive pulmonary disease, unspecified: Secondary | ICD-10-CM | POA: Insufficient documentation

## 2021-10-18 DIAGNOSIS — S0083XA Contusion of other part of head, initial encounter: Secondary | ICD-10-CM | POA: Diagnosis not present

## 2021-10-18 DIAGNOSIS — Y92019 Unspecified place in single-family (private) house as the place of occurrence of the external cause: Secondary | ICD-10-CM | POA: Diagnosis not present

## 2021-10-18 DIAGNOSIS — Z79899 Other long term (current) drug therapy: Secondary | ICD-10-CM | POA: Diagnosis not present

## 2021-10-18 DIAGNOSIS — Z7982 Long term (current) use of aspirin: Secondary | ICD-10-CM | POA: Diagnosis not present

## 2021-10-18 DIAGNOSIS — S0990XA Unspecified injury of head, initial encounter: Secondary | ICD-10-CM | POA: Diagnosis present

## 2021-10-18 DIAGNOSIS — S42214A Unspecified nondisplaced fracture of surgical neck of right humerus, initial encounter for closed fracture: Secondary | ICD-10-CM

## 2021-10-18 MED ORDER — ONDANSETRON 8 MG PO TBDP: 8.0000 mg | ORAL_TABLET | Freq: Once | ORAL | Status: DC

## 2021-10-18 MED ORDER — IBUPROFEN 800 MG PO TABS
800.0000 mg | ORAL_TABLET | Freq: Three times a day (TID) | ORAL | 0 refills | Status: DC
Start: 2021-10-18 — End: 2023-02-17

## 2021-10-18 MED ORDER — IBUPROFEN 800 MG PO TABS
800.0000 mg | ORAL_TABLET | Freq: Once | ORAL | Status: AC
  Administered 2021-10-18: 800 mg via ORAL
  Filled 2021-10-18: qty 1

## 2021-10-18 MED ORDER — HYDROMORPHONE HCL 1 MG/ML IJ SOLN
0.5000 mg | Freq: Once | INTRAMUSCULAR | Status: AC
  Administered 2021-10-18: 0.5 mg via INTRAMUSCULAR
  Filled 2021-10-18: qty 0.5

## 2021-10-18 MED ORDER — OXYCODONE-ACETAMINOPHEN 5-325 MG PO TABS: 1.0000 | ORAL_TABLET | Freq: Once | ORAL | Status: DC

## 2021-10-18 NOTE — ED Triage Notes (Signed)
Pt to the ED after a fall at home due to missing linoleum on the floor. Pt hit her head and has obvious bruising to the right forehead area.  Pt also complains of right arm pain and has a hard time with movement.  Pt denies LOC and blood thinners.

## 2021-12-23 ENCOUNTER — Encounter (INDEPENDENT_AMBULATORY_CARE_PROVIDER_SITE_OTHER): Payer: Self-pay | Admitting: Gastroenterology

## 2021-12-23 ENCOUNTER — Ambulatory Visit (INDEPENDENT_AMBULATORY_CARE_PROVIDER_SITE_OTHER): Payer: BLUE CROSS/BLUE SHIELD | Admitting: Gastroenterology

## 2021-12-23 VITALS — BP 119/80 | HR 82 | Temp 97.9°F | Ht 62.0 in | Wt 155.7 lb

## 2021-12-23 DIAGNOSIS — K582 Mixed irritable bowel syndrome: Secondary | ICD-10-CM

## 2021-12-23 NOTE — Progress Notes (Unsigned)
Pamela Pham, M.D. Gastroenterology & Hepatology [facility] [address] [place], [place] [address]  Primary Care Physician: Curlene Labrum, MD [place] [id number]  I will communicate my assessment and recommendations to the referring MD via EMR.  Problems: IBS-M  History of Present Illness: Pamela Pham is a 56 y.o. female with PMH IBS M, anxiety, asthma, COPD, depression, migraines and hypothyroidism, who presents for follow up of IBS.  The patient was last seen on 06/17/2021. At that time, the patient was ordered celiac antibody testing -this test was never performed.  She was advised to decrease dicyclomine to 10 mg twice a day and to start MiraLAX.  She was also advised to continue Metamucil.  States she is using Miralax as needed, possibly 2 times per week. She is not using Metamucil as it caused a lot of bloating, so she is taking Benefiber every night. With this regimen, she is a having a BM every day.  The patient states that she has felt well and denies having any complaints.  Denies having any nausea, vomiting, fever, chills, hematochezia, melena, hematemesis, abdominal distention, abdominal pain, diarrhea, jaundice, pruritus or weight loss.  Last Colonoscopy: 12/04/2017 - The examined portion of the ileum was normal. - Diverticulosis in the transverse colon. - Diverticulosis in the sigmoid colon. - One 6 mm polyp in the distal sigmoid colon, removed with a hot snare. Resected and retrieved. Path TA - External hemorrhoids.  Recommended repeat in 5 years  Past Medical History: Past Medical History:  Diagnosis Date   Abnormal Pap smear of cervix    Anxiety    Asthma    COPD (chronic obstructive pulmonary disease) ([place])    Depression    Dysmenorrhea    HOH (hard of hearing)    right ear   Hypothyroidism    Migraines    Substance abuse ([place])    recovering addict   Thyroid disease     Past  Surgical History: Past Surgical History:  Procedure Laterality Date   ABDOMINAL AORTOGRAM W/LOWER EXTREMITY Bilateral 08/26/2019   Procedure: ABDOMINAL AORTOGRAM W/LOWER EXTREMITY;  Surgeon: Elam Dutch, MD;  Location: [facility];  Service: Cardiovascular;  Laterality: Bilateral;   AORTIC INTERVENTION N/A 08/26/2019   Procedure: AORTIC INTERVENTION;  Surgeon: Elam Dutch, MD;  Location: [facility];  Service: Cardiovascular;  Laterality: N/A;  distal aortic stent   CERVICAL BIOPSY  W/ LOOP ELECTRODE EXCISION     in pts 20's   CESAREAN SECTION  04/28/2001   CHOLECYSTECTOMY     in late 20's   COLONOSCOPY WITH PROPOFOL N/A 12/04/2017   Procedure: COLONOSCOPY WITH PROPOFOL;  Surgeon: Rogene Houston, MD;  [facility];  Service: Endoscopy;  Laterality: N/A;  1:35-moved to 1235 per Lelon Frohlich - office unable to reach patient to move them up   CYSTOSCOPY N/A 12/19/2013   Procedure: CYSTOSCOPY;  Surgeon: Lyman Speller, MD;  Location: [facility];  Service: Gynecology;  Laterality: N/A;   FOREIGN BODY REMOVAL Right 02/02/2013   Procedure: Exploration right forearm with removal foreign body;  Surgeon: Schuyler Amor, MD;  Location: [facility];  Service: Orthopedics;  Laterality: Right;  Foreign body given to patient.   ILIAC ARTERY STENT     age 60, congenital stricture of vessel   POLYPECTOMY  12/04/2017   Procedure: POLYPECTOMY;  Surgeon: Rogene Houston, MD;  [facility];  Service:  Endoscopy;;  colon   ROBOTIC ASSISTED TOTAL HYSTERECTOMY N/A 12/19/2013   Procedure: ROBOTIC ASSISTED TOTAL HYSTERECTOMY with bilateral salpingectomy .;  Surgeon: Lyman Speller, MD;  Location: [facility];  Service: Gynecology;  Laterality: N/A;    Family History: Family History  Problem Relation Age of Onset   Cancer Maternal Grandmother        leukemia   Diabetes Maternal Grandmother    Stroke Maternal Grandmother    Heart attack Mother     Hypertension Maternal Grandfather    Thyroid disease Paternal Grandmother    Cancer Paternal Uncle     Social History: Social History   Tobacco Use  Smoking Status Every Day   Packs/day: 0.25   Types: Cigarettes  Smokeless Tobacco Never   Social History   Substance and Sexual Activity  Alcohol Use Not Currently   Social History   Substance and Sexual Activity  Drug Use Never   Types: "Crack" cocaine    Allergies: Allergies  Allergen Reactions   Codeine Nausea And Vomiting   Sulfa Antibiotics Nausea And Vomiting    Medications: Current Outpatient Medications  Medication Sig Dispense Refill   albuterol (VENTOLIN HFA) 108 (90 Base) MCG/ACT inhaler Inhale 2 puffs into the lungs every 6 (six) hours as needed for wheezing or shortness of breath.      atorvastatin (LIPITOR) 40 MG tablet Take 40 mg by mouth at bedtime.      clonazePAM (KLONOPIN) 0.5 MG tablet Take 0.5 mg by mouth every evening.      dicyclomine (BENTYL) 10 MG capsule Take 1 capsule (10 mg total) by mouth 2 (two) times daily as needed for spasms. 180 capsule 1   doxycycline (VIBRAMYCIN) 100 MG capsule Take 100 mg by mouth as needed.     FLUoxetine (PROZAC) 10 MG tablet Take 10 mg by mouth daily.     ibuprofen (ADVIL) 800 MG tablet Take 1 tablet (800 mg total) by mouth 3 (three) times daily. 21 tablet 0   levothyroxine (SYNTHROID) 112 MCG tablet Take 112 mcg by mouth daily before breakfast.     mirtazapine (REMERON) 45 MG tablet Take 45 mg by mouth at bedtime.   0   Multiple Vitamins-Minerals (MULTIVITAMIN WITH MINERALS) tablet Take 1 tablet by mouth daily.     omeprazole (PRILOSEC) 20 MG capsule Take 1 capsule (20 mg total) by mouth daily before breakfast. 90 capsule 3   polyethylene glycol powder (GLYCOLAX/MIRALAX) 17 GM/SCOOP powder Take 8.5 g by mouth daily. 255 g 0   psyllium (METAMUCIL SMOOTH TEXTURE) 58.6 % powder Take 1 packet by mouth at bedtime.     SPIRIVA HANDIHALER 18 MCG inhalation capsule Place  18 mcg into inhaler and inhale daily.      No current facility-administered medications for this visit.    Review of Systems: GENERAL: negative for malaise, night sweats HEENT: No changes in hearing or vision, no nose bleeds or other nasal problems. NECK: Negative for lumps, goiter, pain and significant neck swelling RESPIRATORY: Negative for cough, wheezing CARDIOVASCULAR: Negative for chest pain, leg swelling, palpitations, orthopnea GI: SEE HPI MUSCULOSKELETAL: Negative for joint pain or swelling, back pain, and muscle pain. SKIN: Negative for lesions, rash PSYCH: Negative for sleep disturbance, mood disorder and recent psychosocial stressors. HEMATOLOGY Negative for prolonged bleeding, bruising easily, and swollen nodes. ENDOCRINE: Negative for cold or heat intolerance, polyuria, polydipsia and goiter. NEURO: negative for tremor, gait imbalance, syncope and seizures. The remainder of the review of systems is noncontributory.   Physical  Exam: BP 119/80 (BP Location: Left Arm, Patient Position: Sitting, Cuff Size: Small)   Pulse 82   Temp 97.9 F (36.6 C) (Oral)   Ht '5\' 2"'$  (1.575 m)   Wt 155 lb 11.2 oz (70.6 kg)   LMP 12/02/2013   BMI 28.48 kg/m  GENERAL: The patient is AO x3, in no acute distress. HEENT: Head is normocephalic and atraumatic. EOMI are intact. Mouth is well hydrated and without lesions. NECK: Supple. No masses LUNGS: Clear to auscultation. No presence of rhonchi/wheezing/rales. Adequate chest expansion HEART: RRR, normal s1 and s2. ABDOMEN: Soft, nontender, no guarding, no peritoneal signs, and nondistended. BS +. No masses. EXTREMITIES: Without any cyanosis, clubbing, rash, lesions or edema. NEUROLOGIC: AOx3, no focal motor deficit. SKIN: no jaundice, no rashes  Imaging/Labs: as above  I personally reviewed and interpreted the available labs, imaging and endoscopic files.  Impression and Plan: Prue Lingenfelter is a 56 y.o. female with PMH IBS M,  anxiety, asthma, COPD, depression, migraines and hypothyroidism, who presents for follow up of IBS.  Patient reports feeling much better after implementing the use of Benefiber on a regular basis but she has also improved with the use of as needed MiraLAX.  We discussed that this regimen has led to major improvement of her symptoms and she should continue with this for now.  As she did not proceed with celiac disease testing, I will order this test today.  -Continue Benefiber every night -Continue Miralax as needed -Check celiac disease panel  All questions were answered.      Harvel Quale, MD Gastroenterology and Hepatology [facility]

## 2021-12-23 NOTE — Patient Instructions (Signed)
Continue Benefiber every night Continue Miralax as needed Perform blood workup

## 2022-02-09 ENCOUNTER — Other Ambulatory Visit (INDEPENDENT_AMBULATORY_CARE_PROVIDER_SITE_OTHER): Payer: Self-pay | Admitting: Internal Medicine

## 2022-11-21 ENCOUNTER — Encounter (INDEPENDENT_AMBULATORY_CARE_PROVIDER_SITE_OTHER): Payer: Self-pay | Admitting: *Deleted

## 2023-02-11 ENCOUNTER — Other Ambulatory Visit (INDEPENDENT_AMBULATORY_CARE_PROVIDER_SITE_OTHER): Payer: Self-pay | Admitting: Gastroenterology

## 2023-02-17 ENCOUNTER — Other Ambulatory Visit: Payer: Self-pay

## 2023-02-17 ENCOUNTER — Encounter (HOSPITAL_BASED_OUTPATIENT_CLINIC_OR_DEPARTMENT_OTHER): Payer: Self-pay | Admitting: Orthopedic Surgery

## 2023-02-17 NOTE — Progress Notes (Signed)
Message left for Megan at Dr. Bari Edward office regarding vascular surgery clearance and aspirin orders.

## 2023-02-18 ENCOUNTER — Other Ambulatory Visit (INDEPENDENT_AMBULATORY_CARE_PROVIDER_SITE_OTHER): Payer: Self-pay | Admitting: Gastroenterology

## 2023-02-19 ENCOUNTER — Other Ambulatory Visit (INDEPENDENT_AMBULATORY_CARE_PROVIDER_SITE_OTHER): Payer: Self-pay | Admitting: Gastroenterology

## 2023-02-19 NOTE — Telephone Encounter (Signed)
NEEDS AN APPT: PLEASE HAVE PATIENT TO CALL OFFICE FOR AN APPT: THIS IS THE THIRD TIME WE HAVE DENIED THIS WITH YOU! THANKS

## 2023-02-20 NOTE — H&P (Signed)
Pamela Pham is an 56 y.o. female.   Chief Complaint: left elbow mass HPI: patient seen in the office. Patient here for surgery to remove mass from left elbow. No prior elbow surgery  Past Medical History:  Diagnosis Date   Abnormal Pap smear of cervix    Anxiety    Asthma    COPD (chronic obstructive pulmonary disease) (HCC)    Depression    Dysmenorrhea    HOH (hard of hearing)    right ear   Hypothyroidism    Migraines    Substance abuse (HCC)    recovering addict   Thyroid disease     Past Surgical History:  Procedure Laterality Date   ABDOMINAL AORTOGRAM W/LOWER EXTREMITY Bilateral 08/26/2019   Procedure: ABDOMINAL AORTOGRAM W/LOWER EXTREMITY;  Surgeon: Sherren Kerns, MD;  Location: [facility] INVASIVE CV [facility];  Service: Cardiovascular;  Laterality: Bilateral;   AORTIC INTERVENTION N/A 08/26/2019   Procedure: AORTIC INTERVENTION;  Surgeon: Sherren Kerns, MD;  Location: [facility] INVASIVE CV LAB;  Service: Cardiovascular;  Laterality: N/A;  distal aortic stent   CERVICAL BIOPSY  W/ LOOP ELECTRODE EXCISION     in pts 20's   CESAREAN SECTION  04/28/2001   CHOLECYSTECTOMY     in late 20's   COLONOSCOPY WITH PROPOFOL N/A 12/04/2017   Procedure: COLONOSCOPY WITH PROPOFOL;  Surgeon: Malissa Hippo, MD;  Location: [facility];  Service: Endoscopy;  Laterality: N/A;  1:35-moved to 1235 per Dewayne Hatch - office unable to reach patient to move them up   CYSTOSCOPY N/A 12/19/2013   Procedure: CYSTOSCOPY;  Surgeon: Annamaria Boots, MD;  Location: [facility];  Service: Gynecology;  Laterality: N/A;   FOREIGN BODY REMOVAL Right 02/02/2013   Procedure: Exploration right forearm with removal foreign body;  Surgeon: Marlowe Shores, MD;  Location: [facility];  Service: Orthopedics;  Laterality: Right;  Foreign body given to patient.   ILIAC ARTERY STENT     age 69, congenital stricture of vessel   POLYPECTOMY  12/04/2017   Procedure: POLYPECTOMY;  Surgeon: Malissa Hippo, MD;   Location: [facility];  Service: Endoscopy;;  colon   ROBOTIC ASSISTED TOTAL HYSTERECTOMY N/A 12/19/2013   Procedure: ROBOTIC ASSISTED TOTAL HYSTERECTOMY with bilateral salpingectomy .;  Surgeon: Annamaria Boots, MD;  Location: [facility];  Service: Gynecology;  Laterality: N/A;    Family History  Problem Relation Age of Onset   Cancer Maternal Grandmother        leukemia   Diabetes Maternal Grandmother    Stroke Maternal Grandmother    Heart attack Mother    Hypertension Maternal Grandfather    Thyroid disease Paternal Grandmother    Cancer Paternal Uncle    Social History:  reports that she has been smoking cigarettes. She has never used smokeless tobacco. She reports that she does not currently use alcohol. She reports that she does not use drugs.  Allergies:  Allergies  Allergen Reactions   Codeine Nausea And Vomiting   Sulfa Antibiotics Nausea And Vomiting    No medications prior to admission.    No results found for this or any previous visit (from the past 48 hour(s)). No results found.  ROS: no recent illnesses or hospitalizations  Height 5\' 2"  (1.575 m), weight 64.9 kg, last menstrual period 12/02/2013. Physical Exam  General Appearance:  Alert, cooperative, no distress, appears stated age  Head:  Normocephalic, without obvious abnormality, atraumatic  Eyes:  Pupils equal, conjunctiva/corneas clear,  Throat: Lips, mucosa, and tongue normal; teeth and gums normal  Neck: No visible masses     Lungs:   respirations unlabored  Chest Wall:  No tenderness or deformity  Heart:  Regular rate and rhythm,  Abdomen:   Soft, non-tender,         Extremities: LUE: posterior soft tissue mass, good elbow motion, good wirst and forearm motion nvi left hand  Pulses: 2+ and symmetric  Skin: Skin color, texture, turgor normal, no rashes or lesions     Neurologic: Normal     Assessment/Plan Left elbow mass   Left elbow mass excision  R/B/A DISCUSSED WITH PT  IN OFFICE.  PT VOICED UNDERSTANDING OF PLAN CONSENT SIGNED DAY OF SURGERY PT SEEN AND EXAMINED PRIOR TO OPERATIVE PROCEDURE/DAY OF SURGERY SITE MARKED. QUESTIONS ANSWERED WILL GO HOME FOLLOWING SURGERY   WE ARE PLANNING SURGERY FOR YOUR UPPER EXTREMITY. THE RISKS AND BENEFITS OF SURGERY INCLUDE BUT NOT LIMITED TO BLEEDING INFECTION, DAMAGE TO NEARBY NERVES ARTERIES TENDONS, FAILURE OF SURGERY TO ACCOMPLISH ITS INTENDED GOALS, PERSISTENT SYMPTOMS AND NEED FOR FURTHER SURGICAL INTERVENTION. WITH THIS IN MIND WE WILL PROCEED. I HAVE DISCUSSED WITH THE PATIENT THE PRE AND POSTOPERATIVE REGIMEN AND THE DOS AND DON'TS. PT VOICED UNDERSTANDING AND INFORMED CONSENT SIGNED.   Sharma Covert 02/20/2023, 6:26 PM

## 2023-02-20 NOTE — Progress Notes (Signed)
Fax received from [facility] on 02/17/23 for medical clearance/medication hold for left elbow mass excision to be signed by Betsy Pries.  Provider signed on 02/19/23, scanned into pt's chart, media routed via MyChart to sender on 02/20/23.

## 2023-02-22 NOTE — Anesthesia Preprocedure Evaluation (Signed)
Anesthesia Evaluation  Patient identified by MRN, date of birth, ID band Patient awake    Reviewed: Allergy & Precautions, NPO status , Patient's Chart, lab work & pertinent test results  Airway Mallampati: I       Dental  (+) Poor Dentition   Pulmonary asthma , Current Smoker and Patient abstained from smoking.   Pulmonary exam normal        Cardiovascular Normal cardiovascular exam     Neuro/Psych    GI/Hepatic Neg liver ROS,GERD  Medicated,,  Endo/Other  Hypothyroidism    Renal/GU negative Renal ROS  negative genitourinary   Musculoskeletal   Abdominal Normal abdominal exam  (+)   Peds  Hematology   Anesthesia Other Findings   Reproductive/Obstetrics                             Anesthesia Physical Anesthesia Plan  ASA: 2  Anesthesia Plan: General   Post-op Pain Management: Regional block*   Induction: Intravenous  PONV Risk Score and Plan: 3 and Ondansetron, Dexamethasone and Midazolam  Airway Management Planned: LMA  Additional Equipment: None  Intra-op Plan:   Post-operative Plan: Extubation in OR  Informed Consent: I have reviewed the patients History and Physical, chart, labs and discussed the procedure including the risks, benefits and alternatives for the proposed anesthesia with the patient or authorized representative who has indicated his/her understanding and acceptance.     Dental advisory given  Plan Discussed with:   Anesthesia Plan Comments:        Anesthesia Quick Evaluation

## 2023-02-23 ENCOUNTER — Ambulatory Visit (HOSPITAL_BASED_OUTPATIENT_CLINIC_OR_DEPARTMENT_OTHER): Payer: BC Managed Care – PPO | Admitting: Anesthesiology

## 2023-02-23 ENCOUNTER — Ambulatory Visit (HOSPITAL_BASED_OUTPATIENT_CLINIC_OR_DEPARTMENT_OTHER)
Admission: RE | Admit: 2023-02-23 | Discharge: 2023-02-23 | Disposition: A | Discharge status: Home / Self Care | Payer: BC Managed Care – PPO | Attending: Orthopedic Surgery | Admitting: Orthopedic Surgery

## 2023-02-23 ENCOUNTER — Other Ambulatory Visit: Payer: Self-pay

## 2023-02-23 ENCOUNTER — Encounter (HOSPITAL_BASED_OUTPATIENT_CLINIC_OR_DEPARTMENT_OTHER): Payer: Self-pay | Admitting: Orthopedic Surgery

## 2023-02-23 ENCOUNTER — Encounter (HOSPITAL_BASED_OUTPATIENT_CLINIC_OR_DEPARTMENT_OTHER)
Admission: RE | Disposition: A | Discharge status: Home / Self Care | Payer: Self-pay | Source: Home / Self Care | Attending: Orthopedic Surgery

## 2023-02-23 DIAGNOSIS — Z720 Tobacco use: Secondary | ICD-10-CM

## 2023-02-23 DIAGNOSIS — Z79899 Other long term (current) drug therapy: Secondary | ICD-10-CM | POA: Diagnosis not present

## 2023-02-23 DIAGNOSIS — Z8719 Personal history of other diseases of the digestive system: Secondary | ICD-10-CM

## 2023-02-23 DIAGNOSIS — L720 Epidermal cyst: Secondary | ICD-10-CM | POA: Diagnosis not present

## 2023-02-23 DIAGNOSIS — F17210 Nicotine dependence, cigarettes, uncomplicated: Secondary | ICD-10-CM | POA: Insufficient documentation

## 2023-02-23 DIAGNOSIS — K219 Gastro-esophageal reflux disease without esophagitis: Secondary | ICD-10-CM | POA: Diagnosis not present

## 2023-02-23 DIAGNOSIS — J4489 Other specified chronic obstructive pulmonary disease: Secondary | ICD-10-CM | POA: Diagnosis not present

## 2023-02-23 DIAGNOSIS — R2232 Localized swelling, mass and lump, left upper limb: Secondary | ICD-10-CM | POA: Diagnosis present

## 2023-02-23 HISTORY — PX: EXCISION MASS UPPER EXTREMETIES: SHX6704

## 2023-02-23 SURGERY — EXCISION MASS UPPER EXTREMITIES
Anesthesia: General | Site: Elbow | Laterality: Left

## 2023-02-23 MED ORDER — MIDAZOLAM HCL 2 MG/2ML IJ SOLN
INTRAMUSCULAR | Status: DC | PRN
  Administered 2023-02-23: 2 mg via INTRAVENOUS

## 2023-02-23 MED ORDER — MEPERIDINE HCL 25 MG/ML IJ SOLN: 6.2500 mg | INTRAMUSCULAR | Status: DC | PRN

## 2023-02-23 MED ORDER — BUPIVACAINE HCL (PF) 0.25 % IJ SOLN
INTRAMUSCULAR | Status: DC | PRN
  Administered 2023-02-23: 8 mL

## 2023-02-23 MED ORDER — ACETAMINOPHEN 160 MG/5ML PO SOLN: 325.0000 mg | ORAL | Status: DC | PRN

## 2023-02-23 MED ORDER — BUPIVACAINE HCL (PF) 0.5 % IJ SOLN
INTRAMUSCULAR | Status: AC
  Filled 2023-02-23: qty 30

## 2023-02-23 MED ORDER — OXYCODONE HCL 5 MG/5ML PO SOLN
5.0000 mg | Freq: Once | ORAL | Status: DC | PRN
Start: 2023-02-23 — End: 2023-02-23

## 2023-02-23 MED ORDER — OXYCODONE HCL 5 MG PO TABS: 5.0000 mg | ORAL_TABLET | Freq: Once | ORAL | Status: DC | PRN

## 2023-02-23 MED ORDER — DEXAMETHASONE SODIUM PHOSPHATE 10 MG/ML IJ SOLN
INTRAMUSCULAR | Status: DC | PRN
  Administered 2023-02-23: 10 mg via INTRAVENOUS

## 2023-02-23 MED ORDER — LIDOCAINE 2% (20 MG/ML) 5 ML SYRINGE
INTRAMUSCULAR | Status: DC | PRN
  Administered 2023-02-23: 100 mg via INTRAVENOUS

## 2023-02-23 MED ORDER — MIDAZOLAM HCL 2 MG/2ML IJ SOLN
INTRAMUSCULAR | Status: AC
  Filled 2023-02-23: qty 2

## 2023-02-23 MED ORDER — LACTATED RINGERS IV SOLN: INTRAVENOUS | Status: DC

## 2023-02-23 MED ORDER — PROPOFOL 500 MG/50ML IV EMUL
INTRAVENOUS | Status: DC | PRN
  Administered 2023-02-23: 30 ug/kg/min via INTRAVENOUS

## 2023-02-23 MED ORDER — FENTANYL CITRATE (PF) 100 MCG/2ML IJ SOLN
INTRAMUSCULAR | Status: AC
  Filled 2023-02-23: qty 2

## 2023-02-23 MED ORDER — 0.9 % SODIUM CHLORIDE (POUR BTL) OPTIME
TOPICAL | Status: DC | PRN
  Administered 2023-02-23: 60 mL

## 2023-02-23 MED ORDER — BUPIVACAINE HCL (PF) 0.25 % IJ SOLN
INTRAMUSCULAR | Status: AC
  Filled 2023-02-23: qty 30

## 2023-02-23 MED ORDER — EPHEDRINE 5 MG/ML INJ
INTRAVENOUS | Status: AC
  Filled 2023-02-23: qty 5

## 2023-02-23 MED ORDER — CEFAZOLIN SODIUM-DEXTROSE 2-4 GM/100ML-% IV SOLN
INTRAVENOUS | Status: AC
  Filled 2023-02-23: qty 100

## 2023-02-23 MED ORDER — FENTANYL CITRATE (PF) 100 MCG/2ML IJ SOLN
25.0000 ug | INTRAMUSCULAR | Status: DC | PRN
Start: 2023-02-23 — End: 2023-02-23

## 2023-02-23 MED ORDER — FENTANYL CITRATE (PF) 250 MCG/5ML IJ SOLN
INTRAMUSCULAR | Status: DC | PRN
  Administered 2023-02-23 (×2): 50 ug via INTRAVENOUS

## 2023-02-23 MED ORDER — KETOROLAC TROMETHAMINE 30 MG/ML IJ SOLN
30.0000 mg | Freq: Once | INTRAMUSCULAR | Status: AC | PRN
  Administered 2023-02-23: 30 mg via INTRAVENOUS

## 2023-02-23 MED ORDER — ONDANSETRON HCL 4 MG/2ML IJ SOLN
INTRAMUSCULAR | Status: DC | PRN
  Administered 2023-02-23: 4 mg via INTRAVENOUS

## 2023-02-23 MED ORDER — ACETAMINOPHEN 325 MG PO TABS: 325.0000 mg | ORAL_TABLET | ORAL | Status: DC | PRN

## 2023-02-23 MED ORDER — EPHEDRINE SULFATE-NACL 50-0.9 MG/10ML-% IV SOSY
PREFILLED_SYRINGE | INTRAVENOUS | Status: DC | PRN
  Administered 2023-02-23 (×2): 5 mg via INTRAVENOUS

## 2023-02-23 MED ORDER — PROMETHAZINE HCL 25 MG/ML IJ SOLN
12.5000 mg | Freq: Once | INTRAMUSCULAR | Status: DC | PRN
Start: 2023-02-23 — End: 2023-02-23

## 2023-02-23 MED ORDER — POVIDONE-IODINE 7.5 % EX SOLN
Freq: Once | CUTANEOUS | Status: AC
  Filled 2023-02-23: qty 118

## 2023-02-23 MED ORDER — CEFAZOLIN SODIUM-DEXTROSE 2-4 GM/100ML-% IV SOLN: 2.0000 g | INTRAVENOUS | Status: DC

## 2023-02-23 MED ORDER — BUPIVACAINE-EPINEPHRINE (PF) 0.25% -1:200000 IJ SOLN
INTRAMUSCULAR | Status: AC
  Filled 2023-02-23: qty 30

## 2023-02-23 MED ORDER — ONDANSETRON HCL 4 MG/2ML IJ SOLN
INTRAMUSCULAR | Status: AC
  Filled 2023-02-23: qty 2

## 2023-02-23 MED ORDER — PROPOFOL 10 MG/ML IV BOLUS
INTRAVENOUS | Status: DC | PRN
  Administered 2023-02-23: 40 mg via INTRAVENOUS
  Administered 2023-02-23: 160 mg via INTRAVENOUS

## 2023-02-23 MED ORDER — KETOROLAC TROMETHAMINE 30 MG/ML IJ SOLN
INTRAMUSCULAR | Status: AC
  Filled 2023-02-23: qty 1

## 2023-02-23 MED ORDER — DEXAMETHASONE SODIUM PHOSPHATE 10 MG/ML IJ SOLN
INTRAMUSCULAR | Status: AC
  Filled 2023-02-23: qty 1

## 2023-02-23 SURGICAL SUPPLY — 65 items
BLADE SURG 15 STRL LF DISP TIS (BLADE) ×2 IMPLANT
BLADE SURG 15 STRL SS (BLADE) ×1
BNDG CMPR 5X4 CHSV STRCH STRL (GAUZE/BANDAGES/DRESSINGS) ×1
BNDG CMPR 5X4 KNIT ELC UNQ LF (GAUZE/BANDAGES/DRESSINGS) ×2
BNDG CMPR 9X4 STRL LF SNTH (GAUZE/BANDAGES/DRESSINGS) ×1
BNDG COHESIVE 4X5 TAN STRL LF (GAUZE/BANDAGES/DRESSINGS) ×2 IMPLANT
BNDG ELASTIC 4INX 5YD STR LF (GAUZE/BANDAGES/DRESSINGS) ×4 IMPLANT
BNDG ESMARK 4X9 LF (GAUZE/BANDAGES/DRESSINGS) ×2 IMPLANT
BNDG GZE 12X3 1 PLY HI ABS (GAUZE/BANDAGES/DRESSINGS)
BNDG STRETCH GAUZE 3IN X12FT (GAUZE/BANDAGES/DRESSINGS) IMPLANT
CANISTER SUCT 1200ML W/VALVE (MISCELLANEOUS) ×2 IMPLANT
CORD BIPOLAR FORCEPS 12FT (ELECTRODE) IMPLANT
COVER BACK TABLE 60X90IN (DRAPES) ×2 IMPLANT
CUFF TOURN SGL QUICK 18X4 (TOURNIQUET CUFF) IMPLANT
DRAPE EXTREMITY T 121X128X90 (DISPOSABLE) ×2 IMPLANT
DRAPE IMP U-DRAPE 54X76 (DRAPES) IMPLANT
DRAPE SURG 17X23 STRL (DRAPES) IMPLANT
DRSG EMULSION OIL 3X3 NADH (GAUZE/BANDAGES/DRESSINGS) ×2 IMPLANT
DURAPREP 26ML APPLICATOR (WOUND CARE) ×2 IMPLANT
ELECT NDL TIP 2.8 STRL (NEEDLE) IMPLANT
ELECT NEEDLE TIP 2.8 STRL (NEEDLE)
ELECT REM PT RETURN 9FT ADLT (ELECTROSURGICAL)
ELECTRODE REM PT RTRN 9FT ADLT (ELECTROSURGICAL) IMPLANT
GLOVE BIO SURGEON STRL SZ 6.5 (GLOVE) ×2 IMPLANT
GLOVE BIOGEL PI IND STRL 6.5 (GLOVE) ×2 IMPLANT
GLOVE BIOGEL PI IND STRL 8.5 (GLOVE) ×2 IMPLANT
GLOVE INDICATOR 8.5 STRL (GLOVE) ×2 IMPLANT
GLOVE SURG ORTHO 8.0 STRL STRW (GLOVE) ×2 IMPLANT
GOWN STRL REUS W/ TWL XL LVL3 (GOWN DISPOSABLE) ×2 IMPLANT
GOWN STRL REUS W/TWL XL LVL3 (GOWN DISPOSABLE) ×1
NDL 1/2 CIR CATGUT .05X1.09 (NEEDLE) IMPLANT
NDL HYPO 25X1 1.5 SAFETY (NEEDLE) IMPLANT
NEEDLE 1/2 CIR CATGUT .05X1.09 (NEEDLE)
NEEDLE HYPO 25X1 1.5 SAFETY (NEEDLE)
NS IRRIG 1000ML POUR BTL (IV SOLUTION) ×2 IMPLANT
PACK BASIN DAY SURGERY FS (CUSTOM PROCEDURE TRAY) ×2 IMPLANT
PAD CAST 4YDX4 CTTN HI CHSV (CAST SUPPLIES) ×6 IMPLANT
PADDING CAST ABS COTTON 4X4 ST (CAST SUPPLIES) ×2 IMPLANT
PADDING CAST COTTON 4X4 STRL (CAST SUPPLIES) ×3
PENCIL SMOKE EVACUATOR (MISCELLANEOUS) IMPLANT
SLING ARM FOAM STRAP LRG (SOFTGOODS) IMPLANT
SPIKE FLUID TRANSFER (MISCELLANEOUS) IMPLANT
SPLINT FIBERGLASS 3X35 (CAST SUPPLIES) IMPLANT
SPLINT FIBERGLASS 4X30 (CAST SUPPLIES) IMPLANT
SPLINT PLASTER CAST FAST 5X30 (CAST SUPPLIES) IMPLANT
STOCKINETTE 4X48 STRL (DRAPES) ×2 IMPLANT
SUCTION TUBE FRAZIER 10FR DISP (SUCTIONS) ×2 IMPLANT
SUT MNCRL AB 3-0 PS2 18 (SUTURE) IMPLANT
SUT MNCRL AB 4-0 PS2 18 (SUTURE) IMPLANT
SUT MON AB 3-0 SH 27 (SUTURE)
SUT MON AB 3-0 SH27 (SUTURE) IMPLANT
SUT PROLENE 3 0 PS 1 (SUTURE) IMPLANT
SUT PROLENE 4 0 PS 2 18 (SUTURE) IMPLANT
SUT VIC AB 0 CT1 27 (SUTURE)
SUT VIC AB 0 CT1 27XBRD ANBCTR (SUTURE) IMPLANT
SUT VIC AB 2-0 PS2 27 (SUTURE) IMPLANT
SUT VIC AB 2-0 SH 27 (SUTURE)
SUT VIC AB 2-0 SH 27XBRD (SUTURE) IMPLANT
SUT VIC AB 4-0 PS2 18 (SUTURE) IMPLANT
SYR BULB EAR ULCER 3OZ GRN STR (SYRINGE) ×2 IMPLANT
SYR CONTROL 10ML LL (SYRINGE) IMPLANT
TOWEL GREEN STERILE FF (TOWEL DISPOSABLE) ×2 IMPLANT
TRAY DSU PREP LF (CUSTOM PROCEDURE TRAY) ×2 IMPLANT
TUBE CONNECTING 20X1/4 (TUBING) ×2 IMPLANT
UNDERPAD 30X36 HEAVY ABSORB (UNDERPADS AND DIAPERS) ×2 IMPLANT

## 2023-02-23 NOTE — Anesthesia Procedure Notes (Signed)
Procedure Name: LMA Insertion Date/Time: 02/23/2023 7:45 AM  Performed by: Alvera Novel, CRNAPre-anesthesia Checklist: Patient identified, Emergency Drugs available, Suction available and Patient being monitored Patient Re-evaluated:Patient Re-evaluated prior to induction Oxygen Delivery Method: Circle System Utilized Preoxygenation: Pre-oxygenation with 100% oxygen Induction Type: IV induction Ventilation: Mask ventilation without difficulty LMA: LMA inserted LMA Size: 4.0 Number of attempts: 1 Placement Confirmation: positive ETCO2 Tube secured with: Tape Dental Injury: Teeth and Oropharynx as per pre-operative assessment

## 2023-02-23 NOTE — Transfer of Care (Signed)
Immediate Anesthesia Transfer of Care Note  Patient: Pamela Pham  Procedure(s) Performed: EXCISION MASS UPPER EXTREMITIES (ELBOW) (Left: Elbow)  Patient Location: PACU  Anesthesia Type:General  Level of Consciousness: awake, alert , and oriented  Airway & Oxygen Therapy: Patient Spontanous Breathing and Patient connected to face mask oxygen  Post-op Assessment: Report given to RN and Post -op Vital signs reviewed and unstable, Anesthesiologist notified  Post vital signs: Reviewed and stable  Last Vitals:  Vitals Value Taken Time  BP 129/83 02/23/23 0834  Temp 36.2 C 02/23/23 0834  Pulse 73 02/23/23 0840  Resp 13 02/23/23 0840  SpO2 94 % 02/23/23 0840  Vitals shown include unfiled device data.  Last Pain:  Vitals:   02/23/23 0834  TempSrc:   PainSc: 6       Patients Stated Pain Goal: 4 (02/23/23 8657)  Complications: No notable events documented.

## 2023-02-23 NOTE — Anesthesia Postprocedure Evaluation (Signed)
Anesthesia Post Note  Patient: Pamela Pham  Procedure(s) Performed: EXCISION MASS UPPER EXTREMITIES (ELBOW) (Left: Elbow)     Patient location during evaluation: Phase II Anesthesia Type: General Level of consciousness: awake Pain management: pain level controlled Vital Signs Assessment: post-procedure vital signs reviewed and stable Respiratory status: spontaneous breathing Cardiovascular status: stable Postop Assessment: no apparent nausea or vomiting Anesthetic complications: no  No notable events documented.  Last Vitals:  Vitals:   02/23/23 0900 02/23/23 0912  BP: 118/70 130/75  Pulse: 67 67  Resp: 13 16  Temp:  (!) 36.2 C  SpO2: 92% 95%    Last Pain:  Vitals:   02/23/23 0912  TempSrc:   PainSc: 3                  John F Scharlene Corn

## 2023-02-23 NOTE — Op Note (Signed)
PREOPERATIVE DIAGNOSIS: Left elbow mass  POSTOPERATIVE DIAGNOSIS: Left elbow mass excision greater than 3 cm  ATTENDING SURGEON: Dr. Bradly Bienenstock who scrubbed and present for the entire procedure  ASSISTANT SURGEON: None  ANESTHESIA: General Via LMA  OPERATIVE PROCEDURE: Left elbow mass greater than 3 cm excision subcutaneous   IMPLANTS: None  EBL: Minimal  RADIOGRAPHIC INTERPRETATION: None  Surgical specimens: Elbow mass to pathology.  SURGICAL INDICATIONS: Patient is a right-hand-dominant female with the enlarging mass over the posterior medial aspect of the elbow.  Patient elected undergo the above procedure.  Risks of surgery include but not limited to bleeding infection damage nearby nerves arteries or tendons loss of motion of the wrist and digits incomplete relief of symptoms and need for further surgical invention.  Signed informed consent was obtained the day of surgery.  SURGICAL TECHNIQUE: The patient was prepped identified in the preoperative holding area marked apart a marker made on the left elbow and indicate correct operative site.  Patient brought back the operating placed supine on the anesthesia table where the general anesthetic was administered.  Preoperative antibiotics were given prior to skin incision.  A well-padded tourniquet placed on the left brachium and stable the appropriate drape.  Left upper extremities then prepped and draped normal sterile fashion.  Timeout was called the correct site was identified procedure then began.  Attention then turned to the left elbow region.  A curvilinear incision made over the posterior medial aspect of the elbow.  The tourniquet of bed insufflated.  Dissection carried down through the subcutaneous tissue where this subcutaneous mass greater than 3 cm was then carefully and circumferentially dissected.  This had the appearance of an inclusion cyst.  After mass removal the wound was then thoroughly irrigated.  The subcutaneous  tissues were then closed in a layered fashion with Monocryl and Prolene sutures.  Adaptic dressing sterile compressive bandage applied.  7 cc of core percent Marcaine infiltrated locally in the region.  Patient tolerated the procedure well was taken extubated to the recovery room.  POSTOPERATIVE PLAN: Patient discharged to home.  See her back in 11 days for wound check suture removal go over the pathology gradual use and activity

## 2023-02-23 NOTE — Discharge Instructions (Addendum)
KEEP BANDAGE CLEAN AND DRY CALL OFFICE FOR F/U APPT [phone] in 11 days Rx sent to [facility] KEEP HAND ELEVATED ABOVE HEART OK TO APPLY ICE TO OPERATIVE AREA CONTACT OFFICE IF ANY WORSENING PAIN OR CONCERNS.     Post Anesthesia Home Care Instructions  Activity: Get plenty of rest for the remainder of the day. A responsible individual must stay with you for 24 hours following the procedure.  For the next 24 hours, DO NOT: -Drive a car -Advertising copywriter -Drink alcoholic beverages -Take any medication unless instructed by your physician -Make any legal decisions or sign important papers.  Meals: Start with liquid foods such as gelatin or soup. Progress to regular foods as tolerated. Avoid greasy, spicy, heavy foods. If nausea and/or vomiting occur, drink only clear liquids until the nausea and/or vomiting subsides. Call your physician if vomiting continues.  Special Instructions/Symptoms: Your throat may feel dry or sore from the anesthesia or the breathing tube placed in your throat during surgery. If this causes discomfort, gargle with warm salt water. The discomfort should disappear within 24 hours.     No ibuprofen until after 5 pm if needed

## 2023-02-24 ENCOUNTER — Encounter (HOSPITAL_BASED_OUTPATIENT_CLINIC_OR_DEPARTMENT_OTHER): Payer: Self-pay | Admitting: Orthopedic Surgery

## 2023-02-24 LAB — SURGICAL PATHOLOGY
